# Patient Record
Sex: Male | Born: 1965 | Race: Asian | Hispanic: No | Marital: Married | Smoking: Never smoker
Health system: Southern US, Community
[De-identification: ages and names within clinical notes are randomized; demographics above are authoritative.]

## PROBLEM LIST (undated history)

## (undated) DIAGNOSIS — E119 Type 2 diabetes mellitus without complications: Secondary | ICD-10-CM

## (undated) DIAGNOSIS — M4712 Other spondylosis with myelopathy, cervical region: Principal | ICD-10-CM

## (undated) DIAGNOSIS — K219 Gastro-esophageal reflux disease without esophagitis: Secondary | ICD-10-CM

## (undated) DIAGNOSIS — I10 Essential (primary) hypertension: Secondary | ICD-10-CM

## (undated) DIAGNOSIS — E785 Hyperlipidemia, unspecified: Secondary | ICD-10-CM

## (undated) HISTORY — DX: Type 2 diabetes mellitus without complications: E11.9

## (undated) HISTORY — DX: Gastro-esophageal reflux disease without esophagitis: K21.9

## (undated) HISTORY — DX: Other spondylosis with myelopathy, cervical region: M47.12

## (undated) HISTORY — DX: Hyperlipidemia, unspecified: E78.5

## (undated) HISTORY — DX: Essential (primary) hypertension: I10

---

## 2014-08-08 ENCOUNTER — Telehealth: Payer: Self-pay | Admitting: Family Medicine

## 2014-08-08 NOTE — Telephone Encounter (Signed)
Patient has [employer]. Patient medications reviewed and appointment given for 8/9 with [facility].

## 2014-09-05 ENCOUNTER — Ambulatory Visit (INDEPENDENT_AMBULATORY_CARE_PROVIDER_SITE_OTHER): Payer: 59 | Admitting: Family Medicine

## 2014-09-05 ENCOUNTER — Encounter: Payer: Self-pay | Admitting: Family Medicine

## 2014-09-05 ENCOUNTER — Encounter (INDEPENDENT_AMBULATORY_CARE_PROVIDER_SITE_OTHER): Payer: Self-pay

## 2014-09-05 VITALS — BP 134/76 | HR 66 | Temp 98.0°F | Ht 70.0 in | Wt 230.0 lb

## 2014-09-05 DIAGNOSIS — E119 Type 2 diabetes mellitus without complications: Secondary | ICD-10-CM | POA: Insufficient documentation

## 2014-09-05 DIAGNOSIS — K219 Gastro-esophageal reflux disease without esophagitis: Secondary | ICD-10-CM | POA: Diagnosis not present

## 2014-09-05 DIAGNOSIS — Z139 Encounter for screening, unspecified: Secondary | ICD-10-CM | POA: Diagnosis not present

## 2014-09-05 DIAGNOSIS — I8390 Asymptomatic varicose veins of unspecified lower extremity: Secondary | ICD-10-CM | POA: Insufficient documentation

## 2014-09-05 DIAGNOSIS — R350 Frequency of micturition: Secondary | ICD-10-CM

## 2014-09-05 DIAGNOSIS — I8393 Asymptomatic varicose veins of bilateral lower extremities: Secondary | ICD-10-CM | POA: Diagnosis not present

## 2014-09-05 DIAGNOSIS — I10 Essential (primary) hypertension: Secondary | ICD-10-CM | POA: Diagnosis not present

## 2014-09-05 HISTORY — DX: Essential (primary) hypertension: I10

## 2014-09-05 LAB — POCT URINALYSIS DIPSTICK
BILIRUBIN UA: NEGATIVE
Blood, UA: NEGATIVE
Glucose, UA: NEGATIVE
Ketones, UA: NEGATIVE
LEUKOCYTES UA: NEGATIVE
NITRITE UA: NEGATIVE
PROTEIN UA: NEGATIVE
SPEC GRAV UA: 1.01
Urobilinogen, UA: NEGATIVE
pH, UA: 6.5

## 2014-09-05 LAB — POCT UA - MICROSCOPIC ONLY
BACTERIA, U MICROSCOPIC: NEGATIVE
CRYSTALS, UR, HPF, POC: NEGATIVE
Casts, Ur, LPF, POC: NEGATIVE
EPITHELIAL CELLS, URINE PER MICROSCOPY: NEGATIVE
Mucus, UA: NEGATIVE
RBC, URINE, MICROSCOPIC: NEGATIVE
WBC, Ur, HPF, POC: NEGATIVE
Yeast, UA: NEGATIVE

## 2014-09-05 LAB — POCT GLYCOSYLATED HEMOGLOBIN (HGB A1C): Hemoglobin A1C: 7

## 2014-09-05 LAB — POCT UA - MICROALBUMIN: Microalbumin Ur, POC: NEGATIVE mg/L

## 2014-09-05 MED ORDER — GLUCOSE BLOOD VI STRP: ORAL_STRIP | Status: DC

## 2014-09-05 MED ORDER — TAMSULOSIN HCL 0.4 MG PO CAPS: 0.4000 mg | ORAL_CAPSULE | Freq: Every day | ORAL | Status: DC

## 2014-09-05 MED ORDER — JOBST FOR MEN 20-30MMHG LG MISC: Status: DC

## 2014-09-05 MED ORDER — SITAGLIP PHOS-METFORMIN HCL ER 50-1000 MG PO TB24: 1.0000 | ORAL_TABLET | Freq: Two times a day (BID) | ORAL | Status: DC

## 2014-09-05 MED ORDER — SITAGLIPTIN PHOS-METFORMIN HCL 50-1000 MG PO TABS: 1.0000 | ORAL_TABLET | Freq: Two times a day (BID) | ORAL | Status: DC

## 2014-09-05 NOTE — Progress Notes (Signed)
Subjective:  Patient ID: Kenneth Stone, male    DOB: 1965/07/21  Age: 49 y.o. MRN: [id number]  CC: Establish Care; Diabetes; Hypertension; and Gastrophageal Reflux   HPI Kenneth Stone presents for urinary frequency. He has to get up during the night several times. He was concerned this might be his diabetes.  Follow-up of diabetes. Patient doesOccasionally check blood sugar at home At times it will be over 200.Frequently it is 140-160.He is concerned that also he follows his diet his sugar doesn't seem to be under good control. He would like to consider changing medications. Patient denies symptoms such as  polydipsia, excessive hunger, nausea. No significant hypoglycemic spells noted. Medications as noted below. Taking them regularly without complication/adverse reaction being reported today.   Patient in for follow-up of GERD. Currently asymptomatic taking  PPI daily. There is no chest pain or heartburn. No hematemesis and no melena. No dysphagia or choking. Onset is remote. Progression is stable. Complicating factors, none.   History Kenneth Stone has a past medical history of Diabetes mellitus without complication.   He has no past surgical history on file.   His family history is not on file.He reports that he has never smoked. He does not have any smokeless tobacco history on file. He reports that he drinks about 1.2 oz of alcohol per week. He reports that he does not use illicit drugs.  Current Outpatient Prescriptions on File Prior to Visit  Medication Sig Dispense Refill  . glipiZIDE (GLUCOTROL) 10 MG tablet Take 10 mg by mouth 2 (two) times daily.  5  . pantoprazole (PROTONIX) 40 MG tablet Take 40 mg by mouth daily.  5  . polyethylene glycol powder (GLYCOLAX/MIRALAX) powder 17 GRAMS IN 8 OUNCES OF FLUID DAILY  11   No current facility-administered medications on file prior to visit.    ROS Review of Systems  Constitutional: Negative for fever, chills and diaphoresis.  HENT:  Negative for congestion, rhinorrhea and sore throat.   Respiratory: Negative for cough, shortness of breath and wheezing.   Cardiovascular: Negative for chest pain.       Multiple large varicosities at the knees cause numbness and tingling.he was given gabapentin for this and it has not helped.  Gastrointestinal: Negative for nausea, vomiting, abdominal pain, diarrhea, constipation and abdominal distention.  Endocrine: Positive for polyuria. Negative for cold intolerance, heat intolerance and polydipsia.  Genitourinary: Positive for urgency and frequency (with multiple episodes of nocturia). Negative for dysuria, hematuria, decreased urine volume, discharge, enuresis, difficulty urinating, penile pain and testicular pain.  Musculoskeletal: Negative for joint swelling and arthralgias.  Skin: Negative for rash.  Neurological: Negative for dizziness, tremors, seizures, speech difficulty, weakness, light-headedness and headaches.  Hematological: Negative for adenopathy. Does not bruise/bleed easily.  Psychiatric/Behavioral: Negative.     Objective:  BP 134/76 mmHg  Pulse 66  Temp(Src) 98 F (36.7 C) (Oral)  Ht 5' 10"  (1.778 m)  Wt 230 lb (104.327 kg)  BMI 33.00 kg/m2  Physical Exam  Constitutional: He is oriented to person, place, and time. He appears well-developed and well-nourished. No distress.  HENT:  Head: Normocephalic and atraumatic.  Right Ear: External ear normal.  Left Ear: External ear normal.  Nose: Nose normal.  Mouth/Throat: Oropharynx is clear and moist.  Eyes: Conjunctivae and EOM are normal. Pupils are equal, round, and reactive to light.  Neck: Normal range of motion. Neck supple. No tracheal deviation present. No thyromegaly present.  Cardiovascular: Normal rate, regular rhythm, S1 normal, S2 normal, normal heart  sounds and intact distal pulses.  Exam reveals no gallop and no friction rub.   No murmur heard. Large ropey varicosities bilaterally at the distal medial  thighs and at the medial aspect of the knees and upper calves  Pulmonary/Chest: Effort normal and breath sounds normal. No respiratory distress. He has no wheezes. He has no rales.  Abdominal: Soft. Bowel sounds are normal. He exhibits no distension and no mass. There is no tenderness.  Musculoskeletal: Normal range of motion. He exhibits no edema.  Lymphadenopathy:    He has no cervical adenopathy.  Neurological: He is alert and oriented to person, place, and time. He has normal reflexes.  Skin: Skin is warm and dry.  Psychiatric: He has a normal mood and affect. His behavior is normal. Judgment and thought content normal.  Vitals reviewed.   Assessment & Plan:   Kenneth Stone was seen today for establish care, diabetes, hypertension and gastrophageal reflux.  Diagnoses and all orders for this visit:  Diabetes mellitus type 2, controlled, without complications Orders: -     POCT glycosylated hemoglobin (Hb A1C); Standing -     CMP14+EGFR; Standing -     Lipid panel; Standing -     POCT glycosylated hemoglobin (Hb A1C) -     CMP14+EGFR -     Lipid panel -     POCT UA - Microalbumin -     CBC with Differential/Platelet  Essential hypertension Orders: -     Cancel: POCT CBC; Standing -     CMP14+EGFR; Standing -     POCT CBC -     CMP14+EGFR  Screening Orders: -     Vit D  25 hydroxy (rtn osteoporosis monitoring)  Urinary frequency Orders: -     POCT urinalysis dipstick -     POCT UA - Microscopic Only  Varicose vein of leg Orders: -     Ambulatory referral to Vascular Surgery  Gastroesophageal reflux disease without esophagitis  Other orders -     Discontinue: sitaGLIPtin-metformin (JANUMET) 50-1000 MG per tablet; Take 1 tablet by mouth 2 (two) times daily with a meal. -     SitaGLIPtin-MetFORMIN HCl (JANUMET XR) 50-1000 MG TB24; Take 1 tablet by mouth 2 (two) times daily. -     tamsulosin (FLOMAX) 0.4 MG CAPS capsule; Take 1 capsule (0.4 mg total) by mouth daily after  supper. -     glucose blood test strip; Use as instructed -     Elastic Bandages & Supports (JOBST FOR MEN KNEE HIGH/LG) MISC; Wear on both legs, above knee length daily   I have discontinued Kenneth Stone's gabapentin, JENTADUETO, and sitaGLIPtin-metformin. I am also having him start on SitaGLIPtin-MetFORMIN HCl, tamsulosin, glucose blood, and JOBST FOR MEN KNEE HIGH/LG. Additionally, I am having him maintain his glipiZIDE, pantoprazole, polyethylene glycol powder, and quinapril.  Meds ordered this encounter  Medications  . quinapril (ACCUPRIL) 20 MG tablet    Sig: Take 20 mg by mouth at bedtime.  Marland Kitchen DISCONTD: sitaGLIPtin-metformin (JANUMET) 50-1000 MG per tablet    Sig: Take 1 tablet by mouth 2 (two) times daily with a meal.    Dispense:  60 tablet    Refill:  5  . SitaGLIPtin-MetFORMIN HCl (JANUMET XR) 50-1000 MG TB24    Sig: Take 1 tablet by mouth 2 (two) times daily.    Dispense:  60 tablet    Refill:  5  . tamsulosin (FLOMAX) 0.4 MG CAPS capsule    Sig: Take 1 capsule (0.4 mg  total) by mouth daily after supper.    Dispense:  30 capsule    Refill:  5  . glucose blood test strip    Sig: Use as instructed    Dispense:  100 each    Refill:  12  . Elastic Bandages & Supports (JOBST FOR MEN KNEE HIGH/LG) MISC    Sig: Wear on both legs, above knee length daily    Dispense:  2 each    Refill:  11   Results for orders placed or performed in visit on 09/05/14  POCT glycosylated hemoglobin (Hb A1C)  Result Value Ref Range   Hemoglobin A1C 7.0   POCT urinalysis dipstick  Result Value Ref Range   Color, UA gold    Clarity, UA clear    Glucose, UA negative    Bilirubin, UA negative    Ketones, UA negative    Spec Grav, UA 1.010    Blood, UA negative    pH, UA 6.5    Protein, UA negative    Urobilinogen, UA negative    Nitrite, UA negative    Leukocytes, UA Negative Negative  POCT UA - Microalbumin  Result Value Ref Range   Microalbumin Ur, POC negaitve mg/L  POCT UA -  Microscopic Only  Result Value Ref Range   WBC, Ur, HPF, POC negative    RBC, urine, microscopic negative    Bacteria, U Microscopic negative    Mucus, UA negative    Epithelial cells, urine per micros negative    Crystals, Ur, HPF, POC negative    Casts, Ur, LPF, POC negative    Yeast, UA negative      Follow-up: Return in about 3 months (around 12/06/2014).  Claretta Fraise, M.D.

## 2014-09-06 LAB — CBC WITH DIFFERENTIAL/PLATELET
BASOS: 0 %
Basophils Absolute: 0 10*3/uL (ref 0.0–0.2)
EOS (ABSOLUTE): 0.3 10*3/uL (ref 0.0–0.4)
EOS: 3 %
HEMOGLOBIN: 11.8 g/dL — AB (ref 12.6–17.7)
Hematocrit: 36.6 % — ABNORMAL LOW (ref 37.5–51.0)
IMMATURE GRANS (ABS): 0 10*3/uL (ref 0.0–0.1)
IMMATURE GRANULOCYTES: 0 %
LYMPHS: 41 %
Lymphocytes Absolute: 3.2 10*3/uL — ABNORMAL HIGH (ref 0.7–3.1)
MCH: 25.1 pg — ABNORMAL LOW (ref 26.6–33.0)
MCHC: 32.2 g/dL (ref 31.5–35.7)
MCV: 78 fL — ABNORMAL LOW (ref 79–97)
Monocytes Absolute: 0.7 10*3/uL (ref 0.1–0.9)
Monocytes: 9 %
NEUTROS PCT: 47 %
Neutrophils Absolute: 3.6 10*3/uL (ref 1.4–7.0)
Platelets: 236 10*3/uL (ref 150–379)
RBC: 4.71 x10E6/uL (ref 4.14–5.80)
RDW: 15.8 % — ABNORMAL HIGH (ref 12.3–15.4)
WBC: 7.8 10*3/uL (ref 3.4–10.8)

## 2014-09-06 LAB — CMP14+EGFR
A/G RATIO: 1.1 (ref 1.1–2.5)
ALBUMIN: 4.3 g/dL (ref 3.5–5.5)
ALK PHOS: 49 IU/L (ref 39–117)
ALT: 55 IU/L — AB (ref 0–44)
AST: 50 IU/L — ABNORMAL HIGH (ref 0–40)
BILIRUBIN TOTAL: 0.3 mg/dL (ref 0.0–1.2)
BUN/Creatinine Ratio: 19 (ref 9–20)
BUN: 14 mg/dL (ref 6–24)
CO2: 22 mmol/L (ref 18–29)
Calcium: 9.5 mg/dL (ref 8.7–10.2)
Chloride: 99 mmol/L (ref 97–108)
Creatinine, Ser: 0.75 mg/dL — ABNORMAL LOW (ref 0.76–1.27)
GFR, EST AFRICAN AMERICAN: 124 mL/min/{1.73_m2} (ref >59)
GFR, EST NON AFRICAN AMERICAN: 107 mL/min/{1.73_m2} (ref >59)
Globulin, Total: 3.9 g/dL (ref 1.5–4.5)
Glucose: 113 mg/dL — ABNORMAL HIGH (ref 65–99)
Potassium: 4 mmol/L (ref 3.5–5.2)
Sodium: 137 mmol/L (ref 134–144)
TOTAL PROTEIN: 8.2 g/dL (ref 6.0–8.5)

## 2014-09-06 LAB — LIPID PANEL
CHOLESTEROL TOTAL: 198 mg/dL (ref 100–199)
Chol/HDL Ratio: 4 ratio units (ref 0.0–5.0)
HDL: 50 mg/dL (ref >39)
LDL Calculated: 128 mg/dL — ABNORMAL HIGH (ref 0–99)
Triglycerides: 99 mg/dL (ref 0–149)
VLDL Cholesterol Cal: 20 mg/dL (ref 5–40)

## 2014-09-06 LAB — VITAMIN D 25 HYDROXY (VIT D DEFICIENCY, FRACTURES): Vit D, 25-Hydroxy: 25 ng/mL — ABNORMAL LOW (ref 30.0–100.0)

## 2014-09-22 MED ORDER — SITAGLIP PHOS-METFORMIN HCL ER 50-1000 MG PO TB24: 1.0000 | ORAL_TABLET | Freq: Two times a day (BID) | ORAL | Status: DC

## 2014-09-22 MED ORDER — VITAMIN D (ERGOCALCIFEROL) 1.25 MG (50000 UNIT) PO CAPS: 50000.0000 [IU] | ORAL_CAPSULE | ORAL | Status: DC

## 2014-09-22 NOTE — Addendum Note (Signed)
Addended by: Wardell Heath on: 09/22/2014 12:49 PM   Modules accepted: Orders

## 2014-09-25 ENCOUNTER — Telehealth: Payer: Self-pay

## 2014-09-25 MED ORDER — SAXAGLIPTIN-METFORMIN ER 2.5-1000 MG PO TB24: 1.0000 | ORAL_TABLET | Freq: Two times a day (BID) | ORAL | Status: DC

## 2014-09-25 MED ORDER — LINAGLIPTIN-METFORMIN HCL 2.5-500 MG PO TABS: 2.0000 | ORAL_TABLET | Freq: Every day | ORAL | Status: DC

## 2014-09-25 NOTE — Telephone Encounter (Signed)
Insurance denied authorization for Janumet   Has to have 3 month trial and failure to all of the following: Kazano, Jentadueto and Kombiglyze XR

## 2014-09-25 NOTE — Telephone Encounter (Signed)
Tell the patient that this new medication is essentially the same thing as the original one. Take it the same way and let's see how he does.

## 2014-09-26 NOTE — Telephone Encounter (Signed)
Spoke with patient, he states new diabetes medication is not at pharmacy.  Called [facility] in [place]- they have saxagliptin-metformin ready for pick, as well as vitamin D.  Advised patient these medications are ready to be picked up.  Advised patient of message below from Dr. Livia Snellen.

## 2014-10-03 ENCOUNTER — Other Ambulatory Visit: Payer: Self-pay

## 2014-10-03 DIAGNOSIS — I83893 Varicose veins of bilateral lower extremities with other complications: Secondary | ICD-10-CM

## 2014-11-22 ENCOUNTER — Encounter: Payer: Self-pay | Admitting: Surgery

## 2014-11-27 ENCOUNTER — Ambulatory Visit (HOSPITAL_COMMUNITY)
Admission: RE | Admit: 2014-11-27 | Discharge: 2014-11-27 | Disposition: A | Discharge status: Home / Self Care | Payer: 59 | Source: Ambulatory Visit | Attending: Surgery | Admitting: Surgery

## 2014-11-27 ENCOUNTER — Encounter: Payer: Self-pay | Admitting: Surgery

## 2014-11-27 ENCOUNTER — Ambulatory Visit (INDEPENDENT_AMBULATORY_CARE_PROVIDER_SITE_OTHER): Payer: 59 | Admitting: Surgery

## 2014-11-27 VITALS — BP 154/93 | HR 64 | Ht 70.0 in | Wt 231.0 lb

## 2014-11-27 DIAGNOSIS — E119 Type 2 diabetes mellitus without complications: Secondary | ICD-10-CM | POA: Diagnosis not present

## 2014-11-27 DIAGNOSIS — I83893 Varicose veins of bilateral lower extremities with other complications: Secondary | ICD-10-CM | POA: Insufficient documentation

## 2014-11-27 DIAGNOSIS — I10 Essential (primary) hypertension: Secondary | ICD-10-CM | POA: Diagnosis not present

## 2014-11-27 DIAGNOSIS — I872 Venous insufficiency (chronic) (peripheral): Secondary | ICD-10-CM | POA: Diagnosis not present

## 2014-11-27 NOTE — Progress Notes (Signed)
Patient name: Kenneth Stone MRN: [id number] DOB: 27-Nov-1965 Sex: male   Referred by: Self  Reason for referral:  Chief Complaint  Patient presents with  . Re-evaluation    eval vv's     HISTORY OF PRESENT ILLNESS: This is a 49 year old gentleman comes in today with concerns over bilateral lower extremities.  He is concerned that his veins are becoming more prominent.  They also are starting to itch.  He complains that at the end of the day they ache and he notices that he has an indention from wearing his socks at the end of the day.  He denies a history of DVT.  He has never worn compression stockings.  The patient is an recently diagnosed with diabetes.  He also takes medications for hypertension.  Past Medical History  Diagnosis Date  . Diabetes mellitus without complication ([place])   . GERD (gastroesophageal reflux disease)   . Essential hypertension 09/05/2014    No past surgical history on file.  Social History   Social History  . Marital Status: Married    Spouse Name: N/A  . Number of Children: N/A  . Years of Education: N/A   Occupational History  . Not on file.   Social History Main Topics  . Smoking status: Never Smoker   . Smokeless tobacco: Not on file  . Alcohol Use: 1.2 oz/week    2 Standard drinks or equivalent per week  . Drug Use: No  . Sexual Activity: Not on file   Other Topics Concern  . Not on file   Social History Narrative    Family History  Problem Relation Age of Onset  . Cancer Mother   . Heart disease Father     Allergies as of 11/27/2014  . (No Known Allergies)    Current Outpatient Prescriptions on File Prior to Visit  Medication Sig Dispense Refill  . Elastic Bandages & Supports (JOBST FOR MEN KNEE HIGH/LG) MISC Wear on both legs, above knee length daily 2 each 11  . glipiZIDE (GLUCOTROL) 10 MG tablet Take 10 mg by mouth 2 (two) times daily.  5  . glucose blood test strip Use as instructed 100 each 12  . pantoprazole  (PROTONIX) 40 MG tablet Take 40 mg by mouth daily.  5  . polyethylene glycol powder (GLYCOLAX/MIRALAX) powder 17 GRAMS IN 8 OUNCES OF FLUID DAILY  11  . quinapril (ACCUPRIL) 20 MG tablet Take 20 mg by mouth at bedtime.    . Saxagliptin-Metformin 2.05-998 MG TB24 Take 1 tablet by mouth 2 (two) times daily. For diabetes 60 tablet 5  . tamsulosin (FLOMAX) 0.4 MG CAPS capsule Take 1 capsule (0.4 mg total) by mouth daily after supper. 30 capsule 5  . Vitamin D, Ergocalciferol, (DRISDOL) 50000 UNITS CAPS capsule Take 1 capsule (50,000 Units total) by mouth 2 (two) times a week. For 2 months 30 capsule 0   No current facility-administered medications on file prior to visit.     REVIEW OF SYSTEMS: See history of present illness otherwise negative  PHYSICAL EXAMINATION:  Filed Vitals:   11/27/14 1420 11/27/14 1423  BP: 153/88 154/93  Pulse: 64   Height: 5\' 10"  (1.778 m)   Weight: 231 lb (104.781 kg)   SpO2: 100%    Body mass index is 33.15 kg/(m^2). General: The patient appears their stated age.   HEENT:  No gross abnormalities Pulmonary: Respirations are non-labored Abdomen: Soft and non-tender  Musculoskeletal: There are no major deformities.  Neurologic: No focal weakness or paresthesias are detected, Skin: There are no ulcer or rashes noted. Psychiatric: The patient has normal affect. Cardiovascular: There is a regular rate and rhythm without significant murmur appreciated.  One plus pitting edema bilaterally.  Multiple varicosities present mostly in the medial thigh at the knee  Diagnostic Studies: I have reviewed his venous insufficiency ultrasound.  There is no evidence of DVT.  He does have reflux within the right great saphenous vein with maximum diameter 0.95 cm of the saphenofemoral junction.  There is also reflux within the left great saphenous vein with maximum diameter of 0.83 cm of the saphenofemoral junction.  There is reflux within the deep system on the right and the  common femoral-femoral and popliteal vein.  Is also reflux in the left deep system within the common femoral and popliteal vein.   Assessment:  Bilateral chronic venous insufficiency Plan: The patient is concerned that he is having aching pain in his veins as well as worsening swelling.  He has never worn compression stockings.  I feel the first effort was to place him and 20-30 thigh-high compression stockings.  He is getting fitted for repair today in the office.  He will return for follow-up to see how he does in 3 months.  We discussed proceeding with laser ablation of bilateral saphenous vein to possible stab phlebectomy to alleviate some of his symptoms.  He does have deep vein reflux bilaterally so this may not completely resolve his problems but should make it better.     Eldridge Abrahams, M.D. [facility] Office: [phone] Pager:  [phone]

## 2014-12-04 ENCOUNTER — Other Ambulatory Visit: Payer: Self-pay | Admitting: *Deleted

## 2014-12-04 MED ORDER — GLIPIZIDE 10 MG PO TABS: 10.0000 mg | ORAL_TABLET | Freq: Two times a day (BID) | ORAL | Status: DC

## 2014-12-28 ENCOUNTER — Encounter: Payer: Self-pay | Admitting: Family Medicine

## 2014-12-28 ENCOUNTER — Ambulatory Visit (INDEPENDENT_AMBULATORY_CARE_PROVIDER_SITE_OTHER): Payer: 59 | Admitting: Family Medicine

## 2014-12-28 VITALS — BP 146/83 | HR 66 | Temp 97.3°F | Ht 70.0 in | Wt 234.0 lb

## 2014-12-28 DIAGNOSIS — R319 Hematuria, unspecified: Secondary | ICD-10-CM

## 2014-12-28 DIAGNOSIS — I10 Essential (primary) hypertension: Secondary | ICD-10-CM | POA: Diagnosis not present

## 2014-12-28 DIAGNOSIS — E119 Type 2 diabetes mellitus without complications: Secondary | ICD-10-CM

## 2014-12-28 DIAGNOSIS — K219 Gastro-esophageal reflux disease without esophagitis: Secondary | ICD-10-CM

## 2014-12-28 DIAGNOSIS — Z23 Encounter for immunization: Secondary | ICD-10-CM | POA: Diagnosis not present

## 2014-12-28 DIAGNOSIS — E785 Hyperlipidemia, unspecified: Secondary | ICD-10-CM | POA: Diagnosis not present

## 2014-12-28 DIAGNOSIS — E559 Vitamin D deficiency, unspecified: Secondary | ICD-10-CM | POA: Insufficient documentation

## 2014-12-28 LAB — POCT URINALYSIS DIPSTICK
Bilirubin, UA: NEGATIVE
Ketones, UA: NEGATIVE
Leukocytes, UA: NEGATIVE
NITRITE UA: NEGATIVE
PH UA: 6
PROTEIN UA: NEGATIVE
SPEC GRAV UA: 1.02
UROBILINOGEN UA: NEGATIVE

## 2014-12-28 LAB — POCT GLYCOSYLATED HEMOGLOBIN (HGB A1C): Hemoglobin A1C: 7.4

## 2014-12-28 MED ORDER — QUINAPRIL HCL 40 MG PO TABS: 40.0000 mg | ORAL_TABLET | Freq: Every day | ORAL | Status: DC

## 2014-12-28 MED ORDER — VITAMIN D (ERGOCALCIFEROL) 1.25 MG (50000 UNIT) PO CAPS: 50000.0000 [IU] | ORAL_CAPSULE | ORAL | Status: DC

## 2014-12-28 MED ORDER — PANTOPRAZOLE SODIUM 40 MG PO TBEC: 40.0000 mg | DELAYED_RELEASE_TABLET | Freq: Every day | ORAL | Status: DC

## 2014-12-28 NOTE — Progress Notes (Signed)
Subjective:  Patient ID: Kenneth Stone, male    DOB: 03/06/1965  Age: 49 y.o. MRN: [id number]  CC: 6 month follow up   HPI Kenneth Stone presents for  follow-up of hypertension. Patient has no history of headache chest pain or shortness of breath or recent cough. Patient also denies symptoms of TIA such as numbness weakness lateralizing. Patient checks  blood pressure at home and has not had any elevated readings recently. Patient denies side effects from his medication. States taking it regularly until ran out last week.  Patient also  in for follow-up of elevated cholesterol. Doing well without complaints on current medication. Denies side effects of statin including myalgia and arthralgia and nausea. Also in today for liver function testing. Currently no chest pain, shortness of breath or other cardiovascular related symptoms noted.  Follow-up of diabetes. Patient does check blood sugar at home. Readings run between 140 fasting  and 170-200 PP Patient denies symptoms such as polyuria, polydipsia, excessive hunger, nausea No significant hypoglycemic spells noted. Medications as noted below. Taking them regularly without complication/adverse reaction being reported today.    History Kenneth Stone has a past medical history of Diabetes mellitus without complication ([place]); GERD (gastroesophageal reflux disease); Essential hypertension (09/05/2014); and Hyperlipidemia (12/31/2014).   He has no past surgical history on file.   His family history includes Cancer in his mother; Heart disease in his father.He reports that he has never smoked. He does not have any smokeless tobacco history on file. He reports that he drinks about 1.2 oz of alcohol per week. He reports that he does not use illicit drugs.  Current Outpatient Prescriptions on File Prior to Visit  Medication Sig Dispense Refill  . Elastic Bandages & Supports (JOBST FOR MEN KNEE HIGH/LG) MISC Wear on both legs, above knee length daily 2 each 11    . glipiZIDE (GLUCOTROL) 10 MG tablet Take 1 tablet (10 mg total) by mouth 2 (two) times daily. 60 tablet 0  . glucose blood test strip Use as instructed 100 each 12  . polyethylene glycol powder (GLYCOLAX/MIRALAX) powder 17 GRAMS IN 8 OUNCES OF FLUID DAILY  11  . Saxagliptin-Metformin 2.05-998 MG TB24 Take 1 tablet by mouth 2 (two) times daily. For diabetes 60 tablet 5  . tamsulosin (FLOMAX) 0.4 MG CAPS capsule Take 1 capsule (0.4 mg total) by mouth daily after supper. 30 capsule 5   No current facility-administered medications on file prior to visit.    ROS Review of Systems  Constitutional: Negative for fever, chills, diaphoresis and unexpected weight change.  HENT: Negative for congestion, hearing loss, rhinorrhea and sore throat.   Eyes: Negative for visual disturbance.  Respiratory: Negative for cough and shortness of breath.   Cardiovascular: Negative for chest pain.  Gastrointestinal: Negative for abdominal pain, diarrhea and constipation.  Genitourinary: Negative for dysuria and flank pain.  Musculoskeletal: Negative for joint swelling and arthralgias.  Skin: Negative for rash.  Neurological: Negative for dizziness and headaches.  Psychiatric/Behavioral: Negative for sleep disturbance and dysphoric mood.    Objective:  BP 146/83 mmHg  Pulse 66  Temp(Src) 97.3 F (36.3 C) (Oral)  Ht 5' 10"  (1.778 m)  Wt 234 lb (106.142 kg)  BMI 33.58 kg/m2  BP Readings from Last 3 Encounters:  12/28/14 146/83  11/27/14 154/93  09/05/14 134/76    Wt Readings from Last 3 Encounters:  12/28/14 234 lb (106.142 kg)  11/27/14 231 lb (104.781 kg)  09/05/14 230 lb (104.327 kg)     Physical  Exam  Constitutional: He is oriented to person, place, and time. He appears well-developed and well-nourished. No distress.  HENT:  Head: Normocephalic and atraumatic.  Right Ear: External ear normal.  Left Ear: External ear normal.  Nose: Nose normal.  Mouth/Throat: Oropharynx is clear and  moist.  Eyes: Conjunctivae and EOM are normal. Pupils are equal, round, and reactive to light.  Neck: Normal range of motion. Neck supple. No thyromegaly present.  Cardiovascular: Normal rate, regular rhythm and normal heart sounds.   No murmur heard. Pulmonary/Chest: Effort normal and breath sounds normal. No respiratory distress. He has no wheezes. He has no rales.  Abdominal: Soft. Bowel sounds are normal. He exhibits no distension. There is no tenderness.  Lymphadenopathy:    He has no cervical adenopathy.  Neurological: He is alert and oriented to person, place, and time. He has normal reflexes.  Skin: Skin is warm and dry.  Psychiatric: He has a normal mood and affect. His behavior is normal. Judgment and thought content normal.    Lab Results  Component Value Date   HGBA1C 7.4 12/28/2014   HGBA1C 7.0 09/05/2014    Lab Results  Component Value Date   WBC 7.8 09/05/2014   HCT 36.6* 09/05/2014   GLUCOSE 200* 12/28/2014   CHOL 209* 12/28/2014   TRIG 95 12/28/2014   HDL 51 12/28/2014   LDLCALC 139* 12/28/2014   ALT 57* 12/28/2014   AST 49* 12/28/2014   NA 137 12/28/2014   K 4.1 12/28/2014   CL 98 12/28/2014   CREATININE 0.71* 12/28/2014   BUN 16 12/28/2014   CO2 26 12/28/2014   HGBA1C 7.4 12/28/2014    No results found.  Assessment & Plan:   Kenneth Stone was seen today for 6 month follow up.  Diagnoses and all orders for this visit:  Controlled type 2 diabetes mellitus without complication, without long-term current use of insulin ([facility]) -     POCT glycosylated hemoglobin (Hb A1C) -     CMP14+EGFR -     Microalbumin / creatinine urine ratio -     POCT urinalysis dipstick -     Lipid panel -     VITAMIN D 25 Hydroxy (Vit-D Deficiency, Fractures)  Essential hypertension -     CMP14+EGFR  Gastroesophageal reflux disease without esophagitis -     CMP14+EGFR  Vitamin D deficiency -     CMP14+EGFR -     VITAMIN D 25 Hydroxy (Vit-D Deficiency,  Fractures)  Hyperlipidemia -     Lipid panel  Encounter for immunization  Blood in urine -     POCT urinalysis dipstick; Future -     POCT UA - Microscopic Only; Future  Other orders -     quinapril (ACCUPRIL) 40 MG tablet; Take 1 tablet (40 mg total) by mouth at bedtime. -     pantoprazole (PROTONIX) 40 MG tablet; Take 1 tablet (40 mg total) by mouth daily. -     Vitamin D, Ergocalciferol, (DRISDOL) 50000 UNITS CAPS capsule; Take 1 capsule (50,000 Units total) by mouth 2 (two) times a week. For one month. Then switch to 2000 unit tablet OTC -     Flu Vaccine QUAD 36+ mos IM   I have changed Mr. Pheasant quinapril, pantoprazole, and Vitamin D (Ergocalciferol). I am also having him maintain his polyethylene glycol powder, tamsulosin, glucose blood, JOBST FOR MEN KNEE HIGH/LG, Saxagliptin-Metformin, and glipiZIDE.  Meds ordered this encounter  Medications  . quinapril (ACCUPRIL) 40 MG tablet  Sig: Take 1 tablet (40 mg total) by mouth at bedtime.    Dispense:  90 tablet    Refill:  3  . pantoprazole (PROTONIX) 40 MG tablet    Sig: Take 1 tablet (40 mg total) by mouth daily.    Dispense:  90 tablet    Refill:  1  . Vitamin D, Ergocalciferol, (DRISDOL) 50000 UNITS CAPS capsule    Sig: Take 1 capsule (50,000 Units total) by mouth 2 (two) times a week. For one month. Then switch to 2000 unit tablet OTC    Dispense:  8 capsule    Refill:  0     Follow-up: Return in about 3 months (around 03/28/2015) for diabetes.  Claretta Fraise, M.D.

## 2014-12-29 ENCOUNTER — Other Ambulatory Visit: Payer: Self-pay | Admitting: *Deleted

## 2014-12-29 LAB — CMP14+EGFR
ALBUMIN: 4.4 g/dL (ref 3.5–5.5)
ALT: 57 IU/L — ABNORMAL HIGH (ref 0–44)
AST: 49 IU/L — ABNORMAL HIGH (ref 0–40)
Albumin/Globulin Ratio: 1.2 (ref 1.1–2.5)
Alkaline Phosphatase: 47 IU/L (ref 39–117)
BILIRUBIN TOTAL: 0.3 mg/dL (ref 0.0–1.2)
BUN / CREAT RATIO: 23 — AB (ref 9–20)
BUN: 16 mg/dL (ref 6–24)
CALCIUM: 9.8 mg/dL (ref 8.7–10.2)
CO2: 26 mmol/L (ref 18–29)
CREATININE: 0.71 mg/dL — AB (ref 0.76–1.27)
Chloride: 98 mmol/L (ref 97–106)
GFR calc non Af Amer: 109 mL/min/{1.73_m2} (ref >59)
GFR, EST AFRICAN AMERICAN: 126 mL/min/{1.73_m2} (ref >59)
GLOBULIN, TOTAL: 3.7 g/dL (ref 1.5–4.5)
Glucose: 200 mg/dL — ABNORMAL HIGH (ref 65–99)
Potassium: 4.1 mmol/L (ref 3.5–5.2)
SODIUM: 137 mmol/L (ref 136–144)
TOTAL PROTEIN: 8.1 g/dL (ref 6.0–8.5)

## 2014-12-29 LAB — MICROALBUMIN / CREATININE URINE RATIO
Creatinine, Urine: 72.1 mg/dL
MICROALB/CREAT RATIO: 4.7 mg/g creat (ref 0.0–30.0)
Microalbumin, Urine: 3.4 ug/mL

## 2014-12-29 LAB — LIPID PANEL
CHOL/HDL RATIO: 4.1 ratio (ref 0.0–5.0)
CHOLESTEROL TOTAL: 209 mg/dL — AB (ref 100–199)
HDL: 51 mg/dL (ref >39)
LDL CALC: 139 mg/dL — AB (ref 0–99)
TRIGLYCERIDES: 95 mg/dL (ref 0–149)
VLDL CHOLESTEROL CAL: 19 mg/dL (ref 5–40)

## 2014-12-29 LAB — VITAMIN D 25 HYDROXY (VIT D DEFICIENCY, FRACTURES): Vit D, 25-Hydroxy: 30.7 ng/mL (ref 30.0–100.0)

## 2014-12-29 MED ORDER — CANAGLIFLOZIN 300 MG PO TABS: 300.0000 mg | ORAL_TABLET | Freq: Every day | ORAL | Status: DC

## 2014-12-31 ENCOUNTER — Encounter: Payer: Self-pay | Admitting: Family Medicine

## 2014-12-31 DIAGNOSIS — R319 Hematuria, unspecified: Secondary | ICD-10-CM | POA: Insufficient documentation

## 2014-12-31 DIAGNOSIS — E785 Hyperlipidemia, unspecified: Secondary | ICD-10-CM | POA: Insufficient documentation

## 2014-12-31 DIAGNOSIS — Z23 Encounter for immunization: Secondary | ICD-10-CM | POA: Insufficient documentation

## 2014-12-31 HISTORY — DX: Hyperlipidemia, unspecified: E78.5

## 2015-01-15 ENCOUNTER — Other Ambulatory Visit (INDEPENDENT_AMBULATORY_CARE_PROVIDER_SITE_OTHER): Payer: 59

## 2015-01-15 DIAGNOSIS — R319 Hematuria, unspecified: Secondary | ICD-10-CM

## 2015-01-15 LAB — POCT URINALYSIS DIPSTICK
BILIRUBIN UA: NEGATIVE
KETONES UA: NEGATIVE
Leukocytes, UA: NEGATIVE
Nitrite, UA: NEGATIVE
Protein, UA: NEGATIVE
SPEC GRAV UA: 1.01
UROBILINOGEN UA: NEGATIVE
pH, UA: 5

## 2015-01-15 LAB — POCT UA - MICROSCOPIC ONLY
CASTS, UR, LPF, POC: NEGATIVE
CRYSTALS, UR, HPF, POC: NEGATIVE
MUCUS UA: NEGATIVE
WBC, UR, HPF, POC: NEGATIVE
Yeast, UA: NEGATIVE

## 2015-01-30 ENCOUNTER — Telehealth: Payer: Self-pay | Admitting: Family Medicine

## 2015-01-30 NOTE — Telephone Encounter (Signed)
This opens up a lot of concerns/options. I would like to see him in the office to determine which one is right for him. Thanks, WS.

## 2015-01-30 NOTE — Telephone Encounter (Signed)
Please call with more details about how the medication is not working?

## 2015-01-30 NOTE — Telephone Encounter (Signed)
Was just seen at first of December. Please change my medication and send in a new script. Can not afford to come in at this time.

## 2015-02-05 ENCOUNTER — Ambulatory Visit: Payer: Self-pay | Admitting: Pediatrics

## 2015-02-05 ENCOUNTER — Ambulatory Visit: Payer: Self-pay | Admitting: Family Medicine

## 2015-02-06 ENCOUNTER — Ambulatory Visit (INDEPENDENT_AMBULATORY_CARE_PROVIDER_SITE_OTHER): Payer: BLUE CROSS/BLUE SHIELD | Admitting: Family Medicine

## 2015-02-06 ENCOUNTER — Encounter: Payer: Self-pay | Admitting: Family Medicine

## 2015-02-06 ENCOUNTER — Other Ambulatory Visit: Payer: Self-pay | Admitting: Family Medicine

## 2015-02-06 VITALS — BP 139/84 | HR 70 | Temp 97.4°F | Ht 70.0 in | Wt 230.6 lb

## 2015-02-06 DIAGNOSIS — E119 Type 2 diabetes mellitus without complications: Secondary | ICD-10-CM | POA: Diagnosis not present

## 2015-02-06 DIAGNOSIS — I809 Phlebitis and thrombophlebitis of unspecified site: Secondary | ICD-10-CM

## 2015-02-06 DIAGNOSIS — R319 Hematuria, unspecified: Secondary | ICD-10-CM

## 2015-02-06 LAB — POCT UA - MICROSCOPIC ONLY
BACTERIA, U MICROSCOPIC: NEGATIVE
Casts, Ur, LPF, POC: NEGATIVE
Crystals, Ur, HPF, POC: NEGATIVE
Epithelial cells, urine per micros: NEGATIVE
Mucus, UA: NEGATIVE
Yeast, UA: NEGATIVE

## 2015-02-06 LAB — POCT URINALYSIS DIPSTICK
Bilirubin, UA: NEGATIVE
GLUCOSE UA: NEGATIVE
Ketones, UA: NEGATIVE
LEUKOCYTES UA: NEGATIVE
NITRITE UA: NEGATIVE
PROTEIN UA: NEGATIVE
Spec Grav, UA: 1.015
UROBILINOGEN UA: NEGATIVE
pH, UA: 6

## 2015-02-06 MED ORDER — GLIPIZIDE 10 MG PO TABS: 10.0000 mg | ORAL_TABLET | Freq: Two times a day (BID) | ORAL | Status: DC

## 2015-02-06 MED ORDER — SAXAGLIPTIN-METFORMIN ER 2.5-1000 MG PO TB24
1.0000 | ORAL_TABLET | Freq: Two times a day (BID) | ORAL | Status: DC
Start: 2015-02-06 — End: 2015-05-28

## 2015-02-06 MED ORDER — DICLOFENAC SODIUM 75 MG PO TBEC: 75.0000 mg | DELAYED_RELEASE_TABLET | Freq: Two times a day (BID) | ORAL | Status: DC

## 2015-02-06 NOTE — Patient Instructions (Signed)
    Great to meet you!  Take diclofenac 1 pill twice daily      Phlebitis Phlebitis is soreness and puffiness (swelling) in a vein.  HOME CARE  Only take medicine as told by your doctor.  Raise (elevate) the affected limb on a pillow as told by your doctor.  Keep a warm pack on the affected vein as told by your doctor. Do not sleep with a heating pad.  Use special stockings or bandages around the area of the affected vein as told by your doctor. These will speed healing and keep the condition from coming back.  Talk to your doctor about all the medicines you take.  Get follow-up blood tests as told by your doctor.  If the phlebitis is in your legs:  Avoid standing or resting for long periods.  Keep your legs moving. Raise your legs when you sit or lie.  Do not smoke.  Follow-up with your doctor as told. GET HELP IF:  You have strange bruises or bleeding.  Your puffiness or pain in the affected area is not getting better.  You are taking medicine to lessen puffiness (anti-inflammatory medicine), and you get belly pain.  You have a fever. GET HELP RIGHT AWAY IF:   The phlebitis gets worse and you have more pain, puffiness (swelling), or redness.  You have trouble breathing or have chest pain. MAKE SURE YOU:   Understand these instructions.  Will watch your condition.  Will get help right away if you are not doing well or get worse.   This information is not intended to replace advice given to you by your health care provider. Make sure you discuss any questions you have with your health care provider.   Document Released: 01/01/2009 Document Revised: 01/18/2013 Document Reviewed: 09/20/2012 Elsevier Interactive Patient Education [employer].

## 2015-02-06 NOTE — Progress Notes (Addendum)
   HPI  Patient presents today to discuss diabetes and right lesions.  He describes "knots" on his right inner thigh for about 3 days. He states that they're tender to palpation, he has an appointment with a vascular surgeon to address varicose veins at the end of this month. This is never happened before. He has no leg edema, calf tenderness, or difficulty walking. He feels that it's also developing on his left inner thigh.  Diabetes Recently tried invokana but did not like this due to polyuria, stating that he can't sleep due to urinating so much. He requests to go back onto onglyza/metformin and glipizide combination   PMH: Smoking status noted ROS: Per HPI  Objective: BP 139/84 mmHg  Pulse 70  Temp(Src) 97.4 F (36.3 C) (Oral)  Ht 5\' 10"  (1.778 m)  Wt 230 lb 9.6 oz (104.599 kg)  BMI 33.09 kg/m2 Gen: NAD, alert, cooperative with exam HEENT: NCAT CV: RRR, good S1/S2, no murmur Resp: CTABL, no wheezes, non-labored Ext: Right inner thigh with palpable superficial cord on the medial knee, swelling, tenderness to palpation, or Homans sign. Left inner thigh with similar cord with less erythema and tenderness Neuro: Alert and oriented, No gross deficits  Assessment and plan:  # Superficial thrombophlebitis Scheduled NSAIDs Discussed the seriousness of this diagnosis and recommended venous duplex within 48 hours. I considered anticoagulation, however he has no signs of acute DVT is no Tenderness, edema, or Homans sign. I recommended follow-up within 2 weeks when we will consider repeat ultrasound  # Diabetes Patient did not tolerate invokana- continue Restart previous regimen, we discussed more aggressive diet changes Consider GLP-1 if he does not improve after more aggressive diet control  Hematuria Persistent, refer to uriology.  Nursing to call and discuss.   Orders Placed This Encounter  Procedures  . POCT UA - Microscopic Only  . POCT urinalysis dipstick     Meds ordered this encounter  Medications  . glipiZIDE (GLUCOTROL) 10 MG tablet    Sig: Take 1 tablet (10 mg total) by mouth 2 (two) times daily.    Dispense:  60 tablet    Refill:  1  . Saxagliptin-Metformin 2.05-998 MG TB24    Sig: Take 1 tablet by mouth 2 (two) times daily. For diabetes    Dispense:  60 tablet    Refill:  5  . diclofenac (VOLTAREN) 75 MG EC tablet    Sig: Take 1 tablet (75 mg total) by mouth 2 (two) times daily.    Dispense:  30 tablet    Refill:  0    Laroy Apple, MD [facility] 02/06/2015, 6:27 PM

## 2015-02-08 ENCOUNTER — Ambulatory Visit (HOSPITAL_COMMUNITY): Payer: BLUE CROSS/BLUE SHIELD

## 2015-02-12 ENCOUNTER — Ambulatory Visit (HOSPITAL_COMMUNITY): Admission: RE | Admit: 2015-02-12 | Payer: BLUE CROSS/BLUE SHIELD | Source: Ambulatory Visit

## 2015-02-16 ENCOUNTER — Encounter: Payer: Self-pay | Admitting: Vascular Surgery

## 2015-02-19 ENCOUNTER — Encounter: Payer: Self-pay | Admitting: Vascular Surgery

## 2015-02-27 ENCOUNTER — Ambulatory Visit (INDEPENDENT_AMBULATORY_CARE_PROVIDER_SITE_OTHER): Payer: BLUE CROSS/BLUE SHIELD | Admitting: Vascular Surgery

## 2015-02-27 ENCOUNTER — Encounter: Payer: Self-pay | Admitting: Vascular Surgery

## 2015-02-27 VITALS — BP 137/90 | HR 68 | Temp 98.0°F | Resp 16 | Ht 72.0 in | Wt 230.0 lb

## 2015-02-27 DIAGNOSIS — I83893 Varicose veins of bilateral lower extremities with other complications: Secondary | ICD-10-CM | POA: Diagnosis not present

## 2015-02-27 NOTE — Progress Notes (Signed)
Subjective:     Patient ID: Kenneth Stone, male   DOB: 07/29/1965, 50 y.o.   MRN: [id number]  HPI this 50 year old male returns for continued follow-up regarding his painful varicosities and swelling of both lower extremities. He saw Dr. Trula Slade 3 months ago. He has tried long-leg elastic compression stockings 20-30 mm gradient as well as elevation and ibuprofen with no improvement in his pain or swelling. In addition he developed painful swelling involving the varicosities of his right leg from the proximal thigh to just below the knee and he has been treated with anti-inflammatory medications but has not had a repeat venous duplex exam on the right side. He continues to have symptoms on the left side which are affecting his daily living and resistant to conservative measures.  Past Medical History  Diagnosis Date  . Diabetes mellitus without complication ([place])   . GERD (gastroesophageal reflux disease)   . Essential hypertension 09/05/2014  . Hyperlipidemia 12/31/2014    Social History  Substance Use Topics  . Smoking status: Never Smoker   . Smokeless tobacco: Not on file  . Alcohol Use: 1.2 oz/week    2 Standard drinks or equivalent per week    Family History  Problem Relation Age of Onset  . Cancer Mother   . Heart disease Father     No Known Allergies   Current outpatient prescriptions:  .  diclofenac (VOLTAREN) 75 MG EC tablet, Take 1 tablet (75 mg total) by mouth 2 (two) times daily., Disp: 30 tablet, Rfl: 0 .  Elastic Bandages & Supports (JOBST FOR MEN KNEE HIGH/LG) MISC, Wear on both legs, above knee length daily (Patient not taking: Reported on 02/06/2015), Disp: 2 each, Rfl: 11 .  glipiZIDE (GLUCOTROL) 10 MG tablet, Take 1 tablet (10 mg total) by mouth 2 (two) times daily., Disp: 60 tablet, Rfl: 1 .  glucose blood test strip, Use as instructed, Disp: 100 each, Rfl: 12 .  pantoprazole (PROTONIX) 40 MG tablet, Take 1 tablet (40 mg total) by mouth daily., Disp: 90 tablet, Rfl:  1 .  polyethylene glycol powder (GLYCOLAX/MIRALAX) powder, Reported on 02/06/2015, Disp: , Rfl: 11 .  quinapril (ACCUPRIL) 40 MG tablet, Take 1 tablet (40 mg total) by mouth at bedtime. (Patient taking differently: Take 10 mg by mouth at bedtime. ), Disp: 90 tablet, Rfl: 3 .  Saxagliptin-Metformin 2.05-998 MG TB24, Take 1 tablet by mouth 2 (two) times daily. For diabetes, Disp: 60 tablet, Rfl: 5 .  tamsulosin (FLOMAX) 0.4 MG CAPS capsule, Take 1 capsule (0.4 mg total) by mouth daily after supper., Disp: 30 capsule, Rfl: 5 .  Vitamin D, Ergocalciferol, (DRISDOL) 50000 UNITS CAPS capsule, Take 1 capsule (50,000 Units total) by mouth 2 (two) times a week. For one month. Then switch to 2000 unit tablet OTC, Disp: 8 capsule, Rfl: 0  Filed Vitals:   02/27/15 1357  BP: 137/90  Pulse: 68  Temp: 98 F (36.7 C)  Resp: 16  Height: 6' (1.829 m)  Weight: 230 lb (104.327 kg)  SpO2: 98%    Body mass index is 31.19 kg/(m^2).           Review of Systems denies chest pain, dyspnea on exertion, PND, orthopnea, hemoptysis, claudication.     Objective:   Physical Exam BP 137/90 mmHg  Pulse 68  Temp(Src) 98 F (36.7 C)  Resp 16  Ht 6' (1.829 m)  Wt 230 lb (104.327 kg)  BMI 31.19 kg/m2  SpO2 98%  General alert and oriented 3  in no apparent distress Lungs no rhonchi or wheezing Right leg with acute thrombophlebitis medially from the knee to the proximal thigh over the great saphenous vein with thrombosed varicosities. Very few patent varicosities remain. Left leg with large bulging varicosities beginning in the distal medial 5 extending into the medial calf with 1+ distal edema. 3+ dorsalis pedis pulse palpable.  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 In the interim patient developed acute superficial thrombophlebitis right leg with thrombosed right great saphenous vein up to saphenofemoral junction Left leg has persistent gross reflux left great saphenous vein supplying painful bulging varicosities and causing distal edema. The symptoms are resistant to conservative measures and affecting his daily living    Plan:     Patient needs laser ablation left great saphenous vein plus greater than 20 stab phlebectomy for painful varicosities We will proceed with precertification to perform this in the near future to relieve his symptoms which are affecting his daily living and resistant to conservative measures

## 2015-03-07 ENCOUNTER — Ambulatory Visit: Payer: BLUE CROSS/BLUE SHIELD | Admitting: Urology

## 2015-04-11 ENCOUNTER — Other Ambulatory Visit: Payer: Self-pay | Admitting: Urology

## 2015-04-11 ENCOUNTER — Ambulatory Visit (INDEPENDENT_AMBULATORY_CARE_PROVIDER_SITE_OTHER): Payer: BLUE CROSS/BLUE SHIELD | Admitting: Urology

## 2015-04-11 DIAGNOSIS — R319 Hematuria, unspecified: Secondary | ICD-10-CM

## 2015-04-11 DIAGNOSIS — R350 Frequency of micturition: Secondary | ICD-10-CM

## 2015-04-11 DIAGNOSIS — R3129 Other microscopic hematuria: Secondary | ICD-10-CM

## 2015-05-04 ENCOUNTER — Ambulatory Visit (HOSPITAL_COMMUNITY): Payer: BLUE CROSS/BLUE SHIELD

## 2015-05-16 ENCOUNTER — Ambulatory Visit: Payer: BLUE CROSS/BLUE SHIELD | Admitting: Urology

## 2015-05-21 ENCOUNTER — Other Ambulatory Visit: Payer: Self-pay | Admitting: *Deleted

## 2015-05-21 DIAGNOSIS — E119 Type 2 diabetes mellitus without complications: Secondary | ICD-10-CM

## 2015-05-21 MED ORDER — GLIPIZIDE 10 MG PO TABS: 10.0000 mg | ORAL_TABLET | Freq: Two times a day (BID) | ORAL | Status: DC

## 2015-05-24 ENCOUNTER — Ambulatory Visit (INDEPENDENT_AMBULATORY_CARE_PROVIDER_SITE_OTHER): Payer: BLUE CROSS/BLUE SHIELD | Admitting: Nurse Practitioner

## 2015-05-24 VITALS — BP 132/78 | HR 78 | Temp 97.6°F | Ht 72.0 in | Wt 234.0 lb

## 2015-05-24 DIAGNOSIS — L255 Unspecified contact dermatitis due to plants, except food: Secondary | ICD-10-CM | POA: Diagnosis not present

## 2015-05-24 MED ORDER — METHYLPREDNISOLONE ACETATE 80 MG/ML IJ SUSP
80.0000 mg | Freq: Once | INTRAMUSCULAR | Status: AC
  Administered 2015-05-24: 80 mg via INTRAMUSCULAR

## 2015-05-24 NOTE — Progress Notes (Signed)
   Subjective:    Patient ID: Kenneth Stone, male    DOB: January 14, 1966, 50 y.o.   MRN: [id number]  HPI Patient here today C/O rash on face. Started 2 days ago and is spreading- very itchy-painful. Has not been outside doing any yard work or around [employer] that he knows of.   Review of Systems  Constitutional: Negative.   HENT: Negative.   Respiratory: Negative.   Cardiovascular: Negative.   Genitourinary: Negative.   Neurological: Negative.   Psychiatric/Behavioral: Negative.   All other systems reviewed and are negative.      Objective:   Physical Exam  Constitutional: He is oriented to person, place, and time. He appears well-developed and well-nourished. No distress.  Cardiovascular: Normal rate, regular rhythm and normal heart sounds.   Pulmonary/Chest: Effort normal and breath sounds normal.  Neurological: He is alert and oriented to person, place, and time.  Skin: Skin is warm.  Erythematous maculopapular rash on forehead and behind both ears   BP 132/78 mmHg  Pulse 78  Temp(Src) 97.6 F (36.4 C) (Oral)  Ht 6' (1.829 m)  Wt 234 lb (106.142 kg)  BMI 31.73 kg/m2        Assessment & Plan:   1. Contact dermatitis due to plant    Meds ordered this encounter  Medications  . methylPREDNISolone acetate (DEPO-MEDROL) injection 80 mg    Sig:    Avid scratching Cool compresses Calamine lotion OTC Benadryl for itching RTO prn  Mary-Margaret Hassell Done, FNP

## 2015-05-28 ENCOUNTER — Encounter: Payer: Self-pay | Admitting: Family Medicine

## 2015-05-28 ENCOUNTER — Ambulatory Visit (INDEPENDENT_AMBULATORY_CARE_PROVIDER_SITE_OTHER): Payer: BLUE CROSS/BLUE SHIELD

## 2015-05-28 ENCOUNTER — Ambulatory Visit (INDEPENDENT_AMBULATORY_CARE_PROVIDER_SITE_OTHER): Payer: BLUE CROSS/BLUE SHIELD | Admitting: Family Medicine

## 2015-05-28 ENCOUNTER — Encounter (INDEPENDENT_AMBULATORY_CARE_PROVIDER_SITE_OTHER): Payer: Self-pay

## 2015-05-28 VITALS — BP 132/74 | HR 64 | Temp 97.4°F | Ht 73.83 in | Wt 232.6 lb

## 2015-05-28 DIAGNOSIS — W19XXXA Unspecified fall, initial encounter: Secondary | ICD-10-CM

## 2015-05-28 DIAGNOSIS — R319 Hematuria, unspecified: Secondary | ICD-10-CM | POA: Diagnosis not present

## 2015-05-28 DIAGNOSIS — E119 Type 2 diabetes mellitus without complications: Secondary | ICD-10-CM | POA: Diagnosis not present

## 2015-05-28 DIAGNOSIS — R0789 Other chest pain: Secondary | ICD-10-CM

## 2015-05-28 LAB — URINALYSIS, COMPLETE
BILIRUBIN UA: NEGATIVE
GLUCOSE, UA: NEGATIVE
Ketones, UA: NEGATIVE
LEUKOCYTES UA: NEGATIVE
Nitrite, UA: NEGATIVE
PROTEIN UA: NEGATIVE
Specific Gravity, UA: 1.025 (ref 1.005–1.030)
UUROB: 0.2 mg/dL (ref 0.2–1.0)
pH, UA: 6 (ref 5.0–7.5)

## 2015-05-28 LAB — MICROSCOPIC EXAMINATION
BACTERIA UA: NONE SEEN
WBC, UA: NONE SEEN /hpf (ref 0–?)

## 2015-05-28 LAB — BAYER DCA HB A1C WAIVED: HB A1C: 7.5 % — AB (ref <7.0)

## 2015-05-28 MED ORDER — SAXAGLIPTIN-METFORMIN ER 2.5-1000 MG PO TB24: 1.0000 | ORAL_TABLET | Freq: Two times a day (BID) | ORAL | Status: DC

## 2015-05-28 MED ORDER — GLIPIZIDE 10 MG PO TABS: 10.0000 mg | ORAL_TABLET | Freq: Two times a day (BID) | ORAL | Status: DC

## 2015-05-28 MED ORDER — TRAMADOL HCL 50 MG PO TABS: 50.0000 mg | ORAL_TABLET | Freq: Three times a day (TID) | ORAL | Status: DC | PRN

## 2015-05-28 MED ORDER — PANTOPRAZOLE SODIUM 40 MG PO TBEC: 40.0000 mg | DELAYED_RELEASE_TABLET | Freq: Every day | ORAL | Status: DC

## 2015-05-28 NOTE — Patient Instructions (Signed)
Great to see you!  For your rib: Try ice 3-4 times a day 2 aleve twice a day, for only 1 week, then as needed Tramadol for night time pain.

## 2015-05-28 NOTE — Addendum Note (Signed)
Addended by: Chevis Pretty on: 05/28/2015 09:51 AM   Modules accepted: Level of Service

## 2015-05-28 NOTE — Progress Notes (Signed)
   HPI  Patient presents today here after a fall.  Patient explains that 2 days ago he tripped over his dog and fell on his left anterior ribs into the corner of his stairs. Since that time he's had point tenderness and tenderness whenever he takes a deep inspiration He denies any fever, chills, dyspnea.  Diabetes Blood sugar averages 140-150. No hypoglycemia Only taking glipizide as needed Good medication compliance with onglyza met  Hematuria No gross hematuria Cannot afford the CT scan that was ordered by urology, we are discussing this with her financial department. Repeated urine today by his request    PMH: Smoking status noted ROS: Per HPI  Objective: BP 132/74 mmHg  Pulse 64  Temp(Src) 97.4 F (36.3 C) (Oral)  Ht 6' 1.83" (1.875 m)  Wt 232 lb 9.6 oz (105.507 kg)  BMI 30.01 kg/m2 Gen: NAD, alert, cooperative with exam HEENT: NCAT CV: RRR, good S1/S2, no murmur Chest wall: Point tenderness over the eighth and ninth ribs on the left lateral midclavicular line, the area of the BB on the x-ray Resp: CTABL, no wheezes, non-labored Ext: No edema, warm Neuro: Alert and oriented, No gross deficits   Chest x-ray: Suspicion for small rib fracture under the BB on the film, no pneumothorax or acute findings otherwise  Assessment and plan:  # Chest wall pain, possible rib fracture Discussed ice, NSAIDs, tramadol for most severe pain Discussed usual course of illness Return to clinic for any worsening symptoms or failure to improve as expected.  # Type 2 diabetes A1c is stable 7.4-7.5 today Continue current medications, discussed glipizide compliance  # Hematuria Urinalysis with only 0-2 RBCs/HPF today Discussed the CT with our financial department who will help him navigate the process. Discussed that even if the blood has resolved his    Orders Placed This Encounter  Procedures  . DG Ribs Unilateral W/Chest Left    Standing Status: Future     Number of  Occurrences: 1     Standing Expiration Date: 07/27/2016    Order Specific Question:  Reason for Exam (SYMPTOM  OR DIAGNOSIS REQUIRED)    Answer:  fall    Order Specific Question:  Preferred imaging location?    Answer:  Internal  . Bayer DCA Hb A1c Waived  . Urinalysis, Complete    Meds ordered this encounter  Medications  . Saxagliptin-Metformin 2.05-998 MG TB24    Sig: Take 1 tablet by mouth 2 (two) times daily. For diabetes    Dispense:  60 tablet    Refill:  5  . pantoprazole (PROTONIX) 40 MG tablet    Sig: Take 1 tablet (40 mg total) by mouth daily.    Dispense:  90 tablet    Refill:  1  . traMADol (ULTRAM) 50 MG tablet    Sig: Take 1 tablet (50 mg total) by mouth every 8 (eight) hours as needed.    Dispense:  30 tablet    Refill:  0    Laroy Apple, MD [facility] 05/28/2015, 5:03 PM

## 2015-05-29 ENCOUNTER — Other Ambulatory Visit: Payer: Self-pay | Admitting: *Deleted

## 2015-05-29 ENCOUNTER — Telehealth: Payer: Self-pay | Admitting: Family Medicine

## 2015-05-29 DIAGNOSIS — R319 Hematuria, unspecified: Secondary | ICD-10-CM

## 2015-05-29 NOTE — Progress Notes (Signed)
Patient aware.

## 2015-06-05 ENCOUNTER — Ambulatory Visit (HOSPITAL_COMMUNITY)
Admission: RE | Admit: 2015-06-05 | Discharge: 2015-06-05 | Disposition: A | Discharge status: Home / Self Care | Payer: BLUE CROSS/BLUE SHIELD | Source: Ambulatory Visit | Attending: Family Medicine | Admitting: Family Medicine

## 2015-06-05 DIAGNOSIS — R319 Hematuria, unspecified: Secondary | ICD-10-CM | POA: Diagnosis not present

## 2015-06-05 LAB — POCT I-STAT CREATININE: CREATININE: 0.7 mg/dL (ref 0.61–1.24)

## 2015-06-05 MED ORDER — IOPAMIDOL (ISOVUE-300) INJECTION 61%
125.0000 mL | Freq: Once | INTRAVENOUS | Status: AC | PRN
  Administered 2015-06-05: 125 mL via INTRAVENOUS

## 2015-06-08 ENCOUNTER — Ambulatory Visit: Payer: BLUE CROSS/BLUE SHIELD | Admitting: Family Medicine

## 2015-08-28 ENCOUNTER — Ambulatory Visit: Payer: BLUE CROSS/BLUE SHIELD | Admitting: Family Medicine

## 2015-09-07 ENCOUNTER — Ambulatory Visit (INDEPENDENT_AMBULATORY_CARE_PROVIDER_SITE_OTHER): Payer: BLUE CROSS/BLUE SHIELD | Admitting: Family Medicine

## 2015-09-07 ENCOUNTER — Encounter: Payer: Self-pay | Admitting: Family Medicine

## 2015-09-07 VITALS — BP 131/76 | HR 70 | Temp 96.9°F | Ht 73.83 in | Wt 230.4 lb

## 2015-09-07 DIAGNOSIS — I10 Essential (primary) hypertension: Secondary | ICD-10-CM

## 2015-09-07 DIAGNOSIS — K219 Gastro-esophageal reflux disease without esophagitis: Secondary | ICD-10-CM | POA: Diagnosis not present

## 2015-09-07 DIAGNOSIS — E119 Type 2 diabetes mellitus without complications: Secondary | ICD-10-CM

## 2015-09-07 LAB — BAYER DCA HB A1C WAIVED: HB A1C: 7.1 % — AB (ref <7.0)

## 2015-09-07 MED ORDER — GLIPIZIDE 10 MG PO TABS: 10.0000 mg | ORAL_TABLET | Freq: Two times a day (BID) | ORAL | 5 refills | Status: DC

## 2015-09-07 NOTE — Progress Notes (Signed)
   HPI  Patient presents today to follow-up for diabetes, hypertension, and GERD.  Diabetes Good medication compliance No hypoglycemia Average fasting blood sugar is 140-150 Average postprandials 180-200 No foot numbness.   Hypertension No shortness of breath, headache, leg edema, or palpitations. He does have occasional left-sided chest pain which is associated with eating certain foods and improved with Protonix. His GERD is otherwise well controlled with Protonix.  He would like to see an eye doctor in our town.  Also occasionally has nonexertional chest pain that improves with massage in the left chest.   PMH: Smoking status noted ROS: Per HPI  Objective: BP 131/76   Pulse 70   Temp (!) 96.9 F (36.1 C) (Oral)   Ht 6' 1.83" (1.875 m)   Wt 230 lb 6.4 oz (104.5 kg)   BMI 29.72 kg/m  Gen: NAD, alert, cooperative with exam HEENT: NCAT, CV: RRR, good S1/S2, no murmur Resp: CTABL, no wheezes, non-labored Ext: No edema, warm Neuro: Alert and oriented, No gross deficits  Assessment and plan:  # 2 diabetes Reasonably well controlled, A1c 7.1, this is down from 7.53 months ago Continue Onglyza, metformin, and glipizide. Referring ophthalmology  # Hypertension Well controlled Continue quinapril  # Gerd Controlled with PPI, continue    Orders Placed This Encounter  Procedures  . Bayer DCA Hb A1c Waived  . CMP14+EGFR  . Ambulatory referral to Ophthalmology    Referral Priority:   Routine    Referral Type:   Consultation    Referral Reason:   Specialty Services Required    Requested Specialty:   Ophthalmology    Number of Visits Requested:   [place], MD [facility] 09/07/2015, 2:18 PM

## 2015-09-07 NOTE — Patient Instructions (Signed)
Great to see you!  Your diabetes looks pretty good, your A1C was 7.1 which is equivalent to an average glucose of 157. OUr goal is less than 6.5 but this is really not bad and is an improvement since your last check.   Try to cut out the high sugar drinks we talked about.

## 2015-09-08 LAB — CMP14+EGFR
A/G RATIO: 1.1 — AB (ref 1.2–2.2)
ALBUMIN: 4.3 g/dL (ref 3.5–5.5)
ALT: 50 IU/L — AB (ref 0–44)
AST: 43 IU/L — ABNORMAL HIGH (ref 0–40)
Alkaline Phosphatase: 46 IU/L (ref 39–117)
BILIRUBIN TOTAL: 0.3 mg/dL (ref 0.0–1.2)
BUN / CREAT RATIO: 19 (ref 9–20)
BUN: 13 mg/dL (ref 6–24)
CHLORIDE: 99 mmol/L (ref 96–106)
CO2: 24 mmol/L (ref 18–29)
CREATININE: 0.7 mg/dL — AB (ref 0.76–1.27)
Calcium: 9.9 mg/dL (ref 8.7–10.2)
GFR, EST AFRICAN AMERICAN: 126 mL/min/{1.73_m2} (ref >59)
GFR, EST NON AFRICAN AMERICAN: 109 mL/min/{1.73_m2} (ref >59)
GLOBULIN, TOTAL: 3.8 g/dL (ref 1.5–4.5)
Glucose: 93 mg/dL (ref 65–99)
Potassium: 4.2 mmol/L (ref 3.5–5.2)
Sodium: 139 mmol/L (ref 134–144)
TOTAL PROTEIN: 8.1 g/dL (ref 6.0–8.5)

## 2015-09-18 DIAGNOSIS — Z7984 Long term (current) use of oral hypoglycemic drugs: Secondary | ICD-10-CM | POA: Diagnosis not present

## 2015-09-18 DIAGNOSIS — H40013 Open angle with borderline findings, low risk, bilateral: Secondary | ICD-10-CM | POA: Diagnosis not present

## 2015-09-18 DIAGNOSIS — E119 Type 2 diabetes mellitus without complications: Secondary | ICD-10-CM | POA: Diagnosis not present

## 2015-09-18 LAB — HM DIABETES EYE EXAM

## 2015-09-30 DIAGNOSIS — Z23 Encounter for immunization: Secondary | ICD-10-CM | POA: Diagnosis not present

## 2015-11-12 DIAGNOSIS — K080 Exfoliation of teeth due to systemic causes: Secondary | ICD-10-CM | POA: Diagnosis not present

## 2015-12-10 ENCOUNTER — Ambulatory Visit: Payer: BLUE CROSS/BLUE SHIELD | Admitting: Family Medicine

## 2015-12-14 ENCOUNTER — Ambulatory Visit (INDEPENDENT_AMBULATORY_CARE_PROVIDER_SITE_OTHER): Payer: BLUE CROSS/BLUE SHIELD | Admitting: Family Medicine

## 2015-12-14 ENCOUNTER — Encounter: Payer: Self-pay | Admitting: Family Medicine

## 2015-12-14 VITALS — BP 133/84 | HR 71 | Temp 97.3°F | Ht 73.83 in | Wt 231.4 lb

## 2015-12-14 DIAGNOSIS — I10 Essential (primary) hypertension: Secondary | ICD-10-CM | POA: Diagnosis not present

## 2015-12-14 DIAGNOSIS — E119 Type 2 diabetes mellitus without complications: Secondary | ICD-10-CM

## 2015-12-14 DIAGNOSIS — E785 Hyperlipidemia, unspecified: Secondary | ICD-10-CM | POA: Diagnosis not present

## 2015-12-14 DIAGNOSIS — K5900 Constipation, unspecified: Secondary | ICD-10-CM

## 2015-12-14 LAB — BAYER DCA HB A1C WAIVED: HB A1C: 8.4 % — AB (ref <7.0)

## 2015-12-14 MED ORDER — SAXAGLIPTIN-METFORMIN ER 5-1000 MG PO TB24: 1.0000 | ORAL_TABLET | Freq: Every day | ORAL | 3 refills | Status: DC

## 2015-12-14 NOTE — Progress Notes (Signed)
   HPI  Patient presents today here for f/u DM2, HTN, Constipation, and HLD  He is fasting  DM2 Good med compliance Average fasting blood sugars 150-170 Average postprandial 200 250 Hypoglycemia only when patient misses a meal, easily recovers with food. No neuropathy.  Hypertension Not checking blood pressures Good medication compliance No chest pain or palpitations or dyspnea.  Hyperlipidemia Watching his diet moderately, also her diabetes Occupationally active but no formal exercise routine  PMH: Smoking status noted ROS: Per HPI  Objective: BP 133/84   Pulse 71   Temp 97.3 F (36.3 C) (Oral)   Ht 6' 1.83" (1.875 m)   Wt 231 lb 6.4 oz (105 kg)   BMI 29.85 kg/m  Gen: NAD, alert, cooperative with exam HEENT: NCAT CV: RRR, good S1/S2, no murmur Resp: CTABL, no wheezes, non-labored Ext: No edema, warm Neuro: Alert and oriented, No gross deficits  Diabetic Foot Exam - Simple   Simple Foot Form Visual Inspection No deformities, no ulcerations, no other skin breakdown bilaterally:  Yes Sensation Testing Intact to touch and monofilament testing bilaterally:  Yes Pulse Check Posterior Tibialis and Dorsalis pulse intact bilaterally:  Yes Comments     Assessment and plan:  # Type 2 diabetes Control slipping, A1c 8.3 today Increasing Onglyza Continue metformin and glipizide Consider adding SGLT2  # Hyperlipidemia Previously untreated Evidence of fatty liver on CT and liver enzymes Labs, consider statin  # Constipation Mild, intermittent Continue stool softeners and metamucil  # HTN Controlled on quinapril, continue labs     Orders Placed This Encounter  Procedures  . Bayer DCA Hb A1c Waived  . CMP14+EGFR  . CBC with Differential/Platelet  . Lipid panel    Meds ordered this encounter  Medications  . Saxagliptin-Metformin 05-998 MG TB24    Sig: Take 1 tablet by mouth daily.    Dispense:  90 tablet    Refill:  [place],  MD [facility] 12/14/2015, 4:28 PM

## 2015-12-14 NOTE — Patient Instructions (Addendum)
Great to see you!  Your liver enzymes have been stable for a for about 1 year, it is probably because of fatty liver  I will consider cholesterol medicine if your numbers are high  I have increased your saxagliptan ( diabetic medication)

## 2015-12-15 LAB — CBC WITH DIFFERENTIAL/PLATELET
BASOS: 0 %
Basophils Absolute: 0 10*3/uL (ref 0.0–0.2)
EOS (ABSOLUTE): 0.3 10*3/uL (ref 0.0–0.4)
EOS: 4 %
HEMATOCRIT: 35.4 % — AB (ref 37.5–51.0)
HEMOGLOBIN: 11.6 g/dL — AB (ref 12.6–17.7)
IMMATURE GRANS (ABS): 0 10*3/uL (ref 0.0–0.1)
IMMATURE GRANULOCYTES: 0 %
Lymphocytes Absolute: 3.5 10*3/uL — ABNORMAL HIGH (ref 0.7–3.1)
Lymphs: 41 %
MCH: 24.9 pg — ABNORMAL LOW (ref 26.6–33.0)
MCHC: 32.8 g/dL (ref 31.5–35.7)
MCV: 76 fL — AB (ref 79–97)
MONOCYTES: 9 %
Monocytes Absolute: 0.7 10*3/uL (ref 0.1–0.9)
NEUTROS PCT: 46 %
Neutrophils Absolute: 4.1 10*3/uL (ref 1.4–7.0)
Platelets: 263 10*3/uL (ref 150–379)
RBC: 4.66 x10E6/uL (ref 4.14–5.80)
RDW: 15 % (ref 12.3–15.4)
WBC: 8.7 10*3/uL (ref 3.4–10.8)

## 2015-12-15 LAB — CMP14+EGFR
ALBUMIN: 4.3 g/dL (ref 3.5–5.5)
ALK PHOS: 51 IU/L (ref 39–117)
ALT: 60 IU/L — ABNORMAL HIGH (ref 0–44)
AST: 50 IU/L — AB (ref 0–40)
Albumin/Globulin Ratio: 1.1 — ABNORMAL LOW (ref 1.2–2.2)
BUN/Creatinine Ratio: 17 (ref 9–20)
BUN: 15 mg/dL (ref 6–24)
Bilirubin Total: 0.2 mg/dL (ref 0.0–1.2)
CALCIUM: 9.4 mg/dL (ref 8.7–10.2)
CO2: 25 mmol/L (ref 18–29)
CREATININE: 0.9 mg/dL (ref 0.76–1.27)
Chloride: 100 mmol/L (ref 96–106)
GFR calc Af Amer: 114 mL/min/{1.73_m2} (ref >59)
GFR, EST NON AFRICAN AMERICAN: 99 mL/min/{1.73_m2} (ref >59)
GLOBULIN, TOTAL: 3.8 g/dL (ref 1.5–4.5)
GLUCOSE: 146 mg/dL — AB (ref 65–99)
Potassium: 4.2 mmol/L (ref 3.5–5.2)
SODIUM: 139 mmol/L (ref 134–144)
Total Protein: 8.1 g/dL (ref 6.0–8.5)

## 2015-12-15 LAB — LIPID PANEL
CHOL/HDL RATIO: 4.2 ratio (ref 0.0–5.0)
Cholesterol, Total: 200 mg/dL — ABNORMAL HIGH (ref 100–199)
HDL: 48 mg/dL (ref >39)
LDL CALC: 134 mg/dL — AB (ref 0–99)
TRIGLYCERIDES: 88 mg/dL (ref 0–149)
VLDL CHOLESTEROL CAL: 18 mg/dL (ref 5–40)

## 2015-12-17 ENCOUNTER — Other Ambulatory Visit: Payer: Self-pay

## 2015-12-17 MED ORDER — ATORVASTATIN CALCIUM 20 MG PO TABS: 20.0000 mg | ORAL_TABLET | Freq: Every day | ORAL | 0 refills | Status: DC

## 2015-12-25 ENCOUNTER — Telehealth: Payer: Self-pay | Admitting: Family Medicine

## 2015-12-25 NOTE — Telephone Encounter (Signed)
Left message for pt to return call.

## 2015-12-27 ENCOUNTER — Other Ambulatory Visit: Payer: Self-pay | Admitting: Family Medicine

## 2016-01-11 ENCOUNTER — Other Ambulatory Visit: Payer: Self-pay | Admitting: Family Medicine

## 2016-01-17 NOTE — Telephone Encounter (Signed)
Patient advised that Dr. Wendi Snipes increased the saxagliptin-metformin 05/998 and he is only supposed to take it one a day.

## 2016-01-30 ENCOUNTER — Ambulatory Visit (INDEPENDENT_AMBULATORY_CARE_PROVIDER_SITE_OTHER): Payer: BLUE CROSS/BLUE SHIELD | Admitting: Family Medicine

## 2016-01-30 ENCOUNTER — Encounter: Payer: Self-pay | Admitting: Family Medicine

## 2016-01-30 VITALS — BP 140/83 | HR 69 | Temp 97.2°F | Ht 73.83 in | Wt 230.0 lb

## 2016-01-30 DIAGNOSIS — E785 Hyperlipidemia, unspecified: Secondary | ICD-10-CM

## 2016-01-30 NOTE — Patient Instructions (Signed)
Great to see you!  Continue the cholesterol medicine, Lipitor.   We will call with labs within 1 week

## 2016-01-30 NOTE — Progress Notes (Signed)
   HPI  Patient presents today for follow-up for hyperlipidemia.  Patient has taken Lipitor daily without problem. He has had a few random concerns including increasing GERD symptoms. He's had some left pectoral soreness which has come and gone. It is responsive to ibuprofen.  He is watching his diet moderately.  He has noticed that he's having increased urination at night, he suspects this might be due to increasing blood sugar concentrations.  He is not fasting today. PMH: Smoking status noted ROS: Per HPI  Objective: BP 140/83   Pulse 69   Temp 97.2 F (36.2 C) (Oral)   Ht 6' 1.83" (1.875 m)   Wt 230 lb (104.3 kg)   BMI 29.67 kg/m  Gen: NAD, alert, cooperative with exam HEENT: NCAT CV: RRR, good S1/S2, no murmur Chest wall: Mild tenderness to palpation of the left pectoral muscle Resp: CTABL, no wheezes, non-labored Ext: No edema, warm Neuro: Alert and oriented, No gross deficits  Assessment and plan:  # Hyperlipidemia Repeat labs today Continue Lipitor, follow-up 3 months Expect improving LDL.   Orders Placed This Encounter  Procedures  . CMP14+EGFR  . LDL Cholesterol, Direct    No orders of the defined types were placed in this encounter.   Laroy Apple, MD [facility] 01/30/2016, 2:24 PM

## 2016-01-31 ENCOUNTER — Ambulatory Visit: Payer: BLUE CROSS/BLUE SHIELD | Admitting: Family Medicine

## 2016-01-31 LAB — CMP14+EGFR
ALBUMIN: 4.7 g/dL (ref 3.5–5.5)
ALT: 51 IU/L — ABNORMAL HIGH (ref 0–44)
AST: 38 IU/L (ref 0–40)
Albumin/Globulin Ratio: 1.3 (ref 1.2–2.2)
Alkaline Phosphatase: 59 IU/L (ref 39–117)
BUN / CREAT RATIO: 18 (ref 9–20)
BUN: 13 mg/dL (ref 6–24)
Bilirubin Total: 0.3 mg/dL (ref 0.0–1.2)
CALCIUM: 10.1 mg/dL (ref 8.7–10.2)
CO2: 26 mmol/L (ref 18–29)
CREATININE: 0.71 mg/dL — AB (ref 0.76–1.27)
Chloride: 97 mmol/L (ref 96–106)
GFR calc non Af Amer: 108 mL/min/{1.73_m2} (ref >59)
GFR, EST AFRICAN AMERICAN: 125 mL/min/{1.73_m2} (ref >59)
GLUCOSE: 122 mg/dL — AB (ref 65–99)
Globulin, Total: 3.7 g/dL (ref 1.5–4.5)
Potassium: 4.3 mmol/L (ref 3.5–5.2)
Sodium: 139 mmol/L (ref 134–144)
TOTAL PROTEIN: 8.4 g/dL (ref 6.0–8.5)

## 2016-01-31 LAB — LDL CHOLESTEROL, DIRECT: LDL Direct: 86 mg/dL (ref 0–99)

## 2016-02-06 ENCOUNTER — Ambulatory Visit (INDEPENDENT_AMBULATORY_CARE_PROVIDER_SITE_OTHER): Payer: BLUE CROSS/BLUE SHIELD

## 2016-02-06 ENCOUNTER — Ambulatory Visit (INDEPENDENT_AMBULATORY_CARE_PROVIDER_SITE_OTHER): Payer: BLUE CROSS/BLUE SHIELD | Admitting: Family Medicine

## 2016-02-06 ENCOUNTER — Encounter: Payer: Self-pay | Admitting: Family Medicine

## 2016-02-06 ENCOUNTER — Encounter (INDEPENDENT_AMBULATORY_CARE_PROVIDER_SITE_OTHER): Payer: Self-pay

## 2016-02-06 VITALS — BP 133/83 | HR 62 | Temp 97.0°F | Ht 73.83 in | Wt 235.0 lb

## 2016-02-06 DIAGNOSIS — M542 Cervicalgia: Secondary | ICD-10-CM

## 2016-02-06 DIAGNOSIS — M4712 Other spondylosis with myelopathy, cervical region: Secondary | ICD-10-CM

## 2016-02-06 HISTORY — DX: Other spondylosis with myelopathy, cervical region: M47.12

## 2016-02-06 MED ORDER — CYCLOBENZAPRINE HCL 10 MG PO TABS: 10.0000 mg | ORAL_TABLET | Freq: Three times a day (TID) | ORAL | 1 refills | Status: DC | PRN

## 2016-02-06 MED ORDER — KETOROLAC TROMETHAMINE 60 MG/2ML IM SOLN
60.0000 mg | Freq: Once | INTRAMUSCULAR | Status: AC
  Administered 2016-02-06: 60 mg via INTRAMUSCULAR

## 2016-02-06 MED ORDER — DICLOFENAC SODIUM 75 MG PO TBEC: 75.0000 mg | DELAYED_RELEASE_TABLET | Freq: Two times a day (BID) | ORAL | 2 refills | Status: DC

## 2016-02-06 NOTE — Progress Notes (Signed)
Subjective:  Patient ID: Kenneth Stone, male    DOB: 1965-04-15  Age: 51 y.o. MRN: [id number]  CC: Neck Pain (pt here today c/o right side neck pain that radiates down shoulder and right arm especialy with movement of the arm or turning of the head.)   HPI Kenneth Stone presents for Symptoms as noted above starting last night. He did have to lift some tile into a dumpster yesterday. The pain is located at the right posterior base of the neck. It radiates across the superior border of the right shoulder and down the right arm past the elbow and to the wrist. There is no numbness but he has some tingling. The pain is 10/10 and he cannot get comfortable regardless of position. He cannot relax to sleep. He was awake most of the night. Ibuprofen gave a little bit of relief at first ring the night. The pain is primarily a dull ache.   History Kenneth Stone has a past medical history of Diabetes mellitus without complication ([place]); Essential hypertension (09/05/2014); GERD (gastroesophageal reflux disease); Hyperlipidemia (12/31/2014); and Spondylosis, cervical, 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   He has no past surgical history on file.   His family history includes Cancer in his mother; Heart disease in his father.He reports that he has never smoked. He has never used smokeless tobacco. He reports that he drinks about 1.2 oz of alcohol per week . He reports that he does not use drugs.    ROS Review of Systems  Constitutional: Negative for chills, diaphoresis and fever.  HENT: Negative for rhinorrhea and sore throat.   Respiratory: Negative for cough and shortness of breath.   Cardiovascular: Negative for chest pain.  Gastrointestinal: Negative for abdominal pain.  Musculoskeletal: Positive for arthralgias, back pain, myalgias and neck pain.  Skin: Negative for rash.  Neurological: Negative for weakness and headaches.    Objective:  BP 133/83   Pulse 62   Temp 97 F (36.1 C) (Oral)   Ht 6' 1.83" (1.875  m)   Wt 235 lb (106.6 kg)   BMI 30.31 kg/m   BP Readings from Last 3 Encounters:  02/06/16 133/83  01/30/16 140/83  12/14/15 133/84    Wt Readings from Last 3 Encounters:  02/06/16 235 lb (106.6 kg)  01/30/16 230 lb (104.3 kg)  12/14/15 231 lb 6.4 oz (105 kg)     Physical Exam  Constitutional: He is oriented to person, place, and time. He appears well-developed and well-nourished. He appears distressed.  HENT:  Head: Normocephalic and atraumatic.  Right Ear: Tympanic membrane and external ear normal. No decreased hearing is noted.  Left Ear: Tympanic membrane and external ear normal. No decreased hearing is noted.  Mouth/Throat: No oropharyngeal exudate or posterior oropharyngeal erythema.  Eyes: Pupils are equal, round, and reactive to light.  Neck: Normal range of motion. Neck supple.  Cardiovascular: Normal rate, regular rhythm and normal heart sounds.   No murmur heard. Pulmonary/Chest: Effort normal and breath sounds normal. No respiratory distress.  Abdominal: Soft. Bowel sounds are normal. He exhibits no mass. There is no tenderness.  Musculoskeletal: He exhibits tenderness (at the posterior neck at the nuchal ridge and inferiorly into the sperior trapezius).  Neurological: He is alert and oriented to person, place, and time. He has normal reflexes.  Skin: Skin is warm and dry.  Psychiatric: His behavior is normal. Thought content normal.  Vitals reviewed.    Lab Results  Component Value Date   WBC 8.7 12/14/2015   HCT 35.4 (  L) 12/14/2015   PLT 263 12/14/2015   GLUCOSE 122 (H) 01/30/2016   CHOL 200 (H) 12/14/2015   TRIG 88 12/14/2015   HDL 48 12/14/2015   LDLDIRECT 86 01/30/2016   LDLCALC 134 (H) 12/14/2015   ALT 51 (H) 01/30/2016   AST 38 01/30/2016   NA 139 01/30/2016   K 4.3 01/30/2016   CL 97 01/30/2016   CREATININE 0.71 (L) 01/30/2016   BUN 13 01/30/2016   CO2 26 01/30/2016   HGBA1C 7.4 12/28/2014   MICROALBUR negaitve 09/05/2014    Ct Abdomen  Pelvis W Wo Contrast  Result Date: 06/05/2015 CLINICAL DATA:  Hematuria for 3 months. EXAM: CT ABDOMEN AND PELVIS WITHOUT AND WITH CONTRAST TECHNIQUE: Multidetector CT imaging of the abdomen and pelvis was performed following the standard protocol before and following the bolus administration of intravenous contrast. CONTRAST:  137mL ISOVUE-300 IOPAMIDOL (ISOVUE-300) INJECTION 61% COMPARISON:  None. FINDINGS: Lower chest:  No acute findings. Hepatobiliary: Mild to moderate diffuse hepatic steatosis noted. No liver masses identified. Gallbladder is unremarkable. Pancreas: No mass, inflammatory changes, or other significant abnormality. Spleen: Within normal limits in size and appearance. Adrenals/Urinary Tract: No adrenal masses identified. No evidence of urolithiasis or hydronephrosis. No complex cystic or solid renal masses are identified. No masses seen involving the lower urinary tract on delayed urographic phase. Stomach/Bowel: No evidence of obstruction, inflammatory process, or abnormal fluid collections. Vascular/Lymphatic: No pathologically enlarged lymph nodes. No evidence of abdominal aortic aneurysm. Aortic atherosclerotic calcification noted. Reproductive: Normal size prostate.  Symmetric seminal vesicles. Other: None. Musculoskeletal:  No suspicious bone lesions identified. IMPRESSION: No radiographic evidence of urinary tract carcinoma, urolithiasis, or hydronephrosis. Hepatic steatosis incidentally noted. Electronically Signed   By: Earle Gell M.D.   On: 06/05/2015 17:47    Assessment & Plan:   Kenneth Stone was seen today for neck pain.  Diagnoses and all orders for this visit:  Spondylosis, cervical, with myelopathy  Cervicalgia -     DG Cervical Spine Complete; Future -     ketorolac (TORADOL) injection 60 mg; Inject 2 mLs (60 mg total) into the muscle once.  Other orders -     cyclobenzaprine (FLEXERIL) 10 MG tablet; Take 1 tablet (10 mg total) by mouth 3 (three) times daily as needed  for muscle spasms. -     diclofenac (VOLTAREN) 75 MG EC tablet; Take 1 tablet (75 mg total) by mouth 2 (two) times daily. For muscle and  Joint pain   c-spine XR shows moderately severe spondylosis  I am having Kenneth Stone start on cyclobenzaprine and diclofenac. I am also having him maintain his glucose blood, JOBST FOR MEN KNEE HIGH/LG, glipiZIDE, Saxagliptin-Metformin, atorvastatin, pantoprazole, and quinapril. We administered ketorolac.  Meds ordered this encounter  Medications  . ketorolac (TORADOL) injection 60 mg  . cyclobenzaprine (FLEXERIL) 10 MG tablet    Sig: Take 1 tablet (10 mg total) by mouth 3 (three) times daily as needed for muscle spasms.    Dispense:  90 tablet    Refill:  1  . diclofenac (VOLTAREN) 75 MG EC tablet    Sig: Take 1 tablet (75 mg total) by mouth 2 (two) times daily. For muscle and  Joint pain    Dispense:  60 tablet    Refill:  2     Follow-up: Return in about 2 weeks (around 02/20/2016).  Claretta Fraise, M.D.

## 2016-02-11 ENCOUNTER — Ambulatory Visit (INDEPENDENT_AMBULATORY_CARE_PROVIDER_SITE_OTHER): Payer: BLUE CROSS/BLUE SHIELD | Admitting: Family Medicine

## 2016-02-11 ENCOUNTER — Encounter: Payer: Self-pay | Admitting: Family Medicine

## 2016-02-11 VITALS — BP 131/73 | HR 79 | Temp 97.3°F | Ht 73.83 in | Wt 234.6 lb

## 2016-02-11 DIAGNOSIS — M4712 Other spondylosis with myelopathy, cervical region: Secondary | ICD-10-CM

## 2016-02-11 DIAGNOSIS — M542 Cervicalgia: Secondary | ICD-10-CM

## 2016-02-11 MED ORDER — PREDNISONE 10 MG PO TABS: ORAL_TABLET | ORAL | 0 refills | Status: DC

## 2016-02-11 MED ORDER — TRAMADOL HCL 50 MG PO TABS: 50.0000 mg | ORAL_TABLET | Freq: Three times a day (TID) | ORAL | 0 refills | Status: DC | PRN

## 2016-02-11 NOTE — Progress Notes (Signed)
   HPI  Patient presents today here to follow-up for neck pain.  Patient was seen on January 10, one day after he had acute onset right-sided neck pain after lifting tile into a dumpster. The pain continues to radiate from the midline right neck all the way down to his first through third fingers. He complains of numbness on the first through third fingers.  He denies any arm weakness or loss of strength.  He was treated with NSAIDs and Flexeril which have not improved his symptoms.  He states that it's difficult to brush his teeth, get dressed, sleep, or do any activities except for walk. He denies any leg weakness or difficulty walking.  PMH: Smoking status noted ROS: Per HPI  Objective: BP 131/73   Pulse 79   Temp 97.3 F (36.3 C) (Oral)   Ht 6' 1.83" (1.875 m)   Wt 234 lb 9.6 oz (106.4 kg)   BMI 30.26 kg/m  Gen: NAD, alert, cooperative with exam HEENT: NCAT CV: RRR, good S1/S2, no murmur Resp: CTABL, no wheezes, non-labored Ext: No edema, warm Neuro: Alert and oriented, strength 5/5 and sensation intact in bilateral grip and flexion and extension of upper extremities. Flexion of the lower right arm causes reproduction of pain MSK mild tenderness to palpation of lower cervical spine and paraspinal muscles Describes numbness of R thumb and first finger  Assessment and plan:  # Acute neck pain, cervical spondylosis with myelopathy Radicular radiation of pain in C6 distribution possibly affecting C 7 as well Prednisone course with caution- diabetes Tramadol, continue flexeril if needed MRI if no improvement vs surgery referral ( consider [facility] at [place])  Discussed no driving after tramadol  Meds ordered this encounter  Medications  . predniSONE (DELTASONE) 10 MG tablet    Sig: Take 4 pills a day for 3 days, then 3 pills a day for 3 days, then 2 pills a day for 3 days, then 1 pill a day for 3 days, then stop    Dispense:  30 tablet    Refill:  0  .  traMADol (ULTRAM) 50 MG tablet    Sig: Take 1 tablet (50 mg total) by mouth every 8 (eight) hours as needed.    Dispense:  30 tablet    Refill:  0    Laroy Apple, MD [facility] 02/11/2016, 9:22 AM

## 2016-02-11 NOTE — Patient Instructions (Signed)
Great to see you!  Start prednisone today for the pain, the effects of the medicine will set in over the next 12-24 hours.   Try tramadol for night time pain, it is a narcotic similar to morphine so do not drive after you take it.   If you are not better in 1 week or if you are losing strength in your arm please come back. We can then pursue an MRI or send you to a surgeon if needed.

## 2016-02-20 ENCOUNTER — Ambulatory Visit (INDEPENDENT_AMBULATORY_CARE_PROVIDER_SITE_OTHER): Payer: BLUE CROSS/BLUE SHIELD | Admitting: Pediatrics

## 2016-02-20 ENCOUNTER — Encounter: Payer: Self-pay | Admitting: Pediatrics

## 2016-02-20 VITALS — BP 133/84 | HR 70 | Temp 97.6°F | Ht 73.83 in | Wt 228.2 lb

## 2016-02-20 DIAGNOSIS — G629 Polyneuropathy, unspecified: Secondary | ICD-10-CM

## 2016-02-20 DIAGNOSIS — M792 Neuralgia and neuritis, unspecified: Secondary | ICD-10-CM | POA: Diagnosis not present

## 2016-02-20 MED ORDER — GABAPENTIN 300 MG PO CAPS: 300.0000 mg | ORAL_CAPSULE | Freq: Two times a day (BID) | ORAL | 2 refills | Status: DC

## 2016-02-20 NOTE — Patient Instructions (Signed)
aspercream for arm pain

## 2016-02-20 NOTE — Progress Notes (Signed)
  Subjective:   Patient ID: Kenneth Stone, male    DOB: Aug 17, 1965, 51 y.o.   MRN: [id number] CC: Arm Pain (right)  HPI: Kenneth Stone is a 51 y.o. 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 Allergies and medications reviewed and updated. History  Smoking Status  . Never Smoker  Smokeless Tobacco  . Never Used   ROS: Per HPI   Objective:    BP 133/84   Pulse 70   Temp 97.6 F (36.4 C) (Oral)   Ht 6' 1.83" (1.875 m)   Wt 228 lb 3.2 oz (103.5 kg)   BMI 29.43 kg/m   Wt Readings from Last 3 Encounters:  02/20/16 228 lb 3.2 oz (103.5 kg)  02/11/16 234 lb 9.6 oz (106.4 kg)  02/06/16 235 lb (106.6 kg)    Gen: NAD, alert, cooperative with exam, NCAT EYES: EOMI, no conjunctival injection, or no icterus CV: distal pulses 2+ b/l  Resp:  normal WOB Ext: No edema, warm Neuro: Alert and oriented, numb to touch flexor surface 1st, 2nd fingers finger tips only MSK: no TTP over cervical spine, R arm normal to inspection, no redness or swelling, 5/5 strength b/l biceps, triceps, hand grip No TTP over R shoulder joint Pain with palpation over R upper arm soft tissue  Assessment & Plan:  Kenneth Stone was seen today for arm pain.  Diagnoses and all orders for this visit:  Neuropathy  ([place]) Trial of below for pain Cont NSAIDs, rest, can try aspercream -     gabapentin (NEURONTIN) 300 MG capsule; Take 1 capsule (300 mg total) by mouth 2 (two) times daily.  Radicular pain in right arm Pain preventing him from working, constant all the time No improvement since starting -     [place]; Future   Follow up plan: Return in about 2 weeks (around 03/05/2016) for to see Dr Wendi Snipes. Assunta Found, MD [place]

## 2016-02-22 ENCOUNTER — Telehealth: Payer: Self-pay | Admitting: Family Medicine

## 2016-02-23 NOTE — Telephone Encounter (Signed)
In process 

## 2016-03-04 ENCOUNTER — Telehealth: Payer: Self-pay | Admitting: Family Medicine

## 2016-03-04 DIAGNOSIS — M5410 Radiculopathy, site unspecified: Secondary | ICD-10-CM | POA: Insufficient documentation

## 2016-03-04 NOTE — Telephone Encounter (Signed)
Lets get him to ortho considering his persistent pain.   Refer to orthopedics.   Laroy Apple, MD [facility] 03/04/2016, 4:23 PM

## 2016-03-04 NOTE — Telephone Encounter (Signed)
lmtcb

## 2016-03-10 DIAGNOSIS — M5412 Radiculopathy, cervical region: Secondary | ICD-10-CM | POA: Diagnosis not present

## 2016-03-17 ENCOUNTER — Ambulatory Visit: Payer: BLUE CROSS/BLUE SHIELD | Admitting: Family Medicine

## 2016-03-31 ENCOUNTER — Other Ambulatory Visit: Payer: Self-pay | Admitting: Family Medicine

## 2016-03-31 DIAGNOSIS — E119 Type 2 diabetes mellitus without complications: Secondary | ICD-10-CM

## 2016-04-17 DIAGNOSIS — E119 Type 2 diabetes mellitus without complications: Secondary | ICD-10-CM | POA: Diagnosis not present

## 2016-04-17 DIAGNOSIS — H40013 Open angle with borderline findings, low risk, bilateral: Secondary | ICD-10-CM | POA: Diagnosis not present

## 2016-04-17 DIAGNOSIS — Z7984 Long term (current) use of oral hypoglycemic drugs: Secondary | ICD-10-CM | POA: Diagnosis not present

## 2016-04-24 ENCOUNTER — Other Ambulatory Visit: Payer: Self-pay | Admitting: Family Medicine

## 2016-04-29 ENCOUNTER — Ambulatory Visit (INDEPENDENT_AMBULATORY_CARE_PROVIDER_SITE_OTHER): Payer: BLUE CROSS/BLUE SHIELD | Admitting: Family Medicine

## 2016-04-29 ENCOUNTER — Encounter: Payer: Self-pay | Admitting: Family Medicine

## 2016-04-29 VITALS — BP 129/82 | HR 75 | Temp 98.5°F | Ht 73.83 in | Wt 228.4 lb

## 2016-04-29 DIAGNOSIS — J209 Acute bronchitis, unspecified: Secondary | ICD-10-CM | POA: Diagnosis not present

## 2016-04-29 DIAGNOSIS — E785 Hyperlipidemia, unspecified: Secondary | ICD-10-CM | POA: Diagnosis not present

## 2016-04-29 DIAGNOSIS — E119 Type 2 diabetes mellitus without complications: Secondary | ICD-10-CM | POA: Diagnosis not present

## 2016-04-29 LAB — BAYER DCA HB A1C WAIVED: HB A1C: 9.6 % — AB (ref <7.0)

## 2016-04-29 MED ORDER — AMOXICILLIN-POT CLAVULANATE 875-125 MG PO TABS: 1.0000 | ORAL_TABLET | Freq: Two times a day (BID) | ORAL | 0 refills | Status: DC

## 2016-04-29 NOTE — Progress Notes (Signed)
   HPI  Patient presents today here with cough.  Patient explains that over the last 2-3 days he's had cough, sore throat, right ear pain, and difficulty sleeping because of it.  He began having some muscle aches. He has subjective fever.  He is tolerating food and fluids like usual.  Patient has had difficulty with diabetes lately patient states that his fasting blood sugars around 150-200, he states that he frequently has to 50s and low 300s after eating.  PMH: Smoking status noted ROS: Per HPI  Objective: BP 129/82   Pulse 75   Temp 98.5 F (36.9 C) (Oral)   Ht 6' 1.83" (1.875 m)   Wt 228 lb 6.4 oz (103.6 kg)   BMI 29.46 kg/m  Gen: NAD, alert, cooperative with exam HEENT: NCAT oropharynx clear, nares clear, TMs normal bilaterally CV: RRR, good S1/S2, no murmur Resp: CTABL, no wheezes, non-labored Ext: No edema, warm Neuro: Alert and oriented, No gross deficits  Assessment and plan:  # Acute bronchitis Patient with cough, sore throat. Patient also with subjective fever, concern for developing pneumonia. Treat with Augmentin I believe these had a decline in his diabetic control some treating more aggressively  # Type 2 diabetes Uncontrolled fasting blood sugars, A1c on the way out today, changing follow-up for about 2 weeks for from now. Consider starting farxiga  # HLD Started statin in Nov, Repeat CMP LDL    Orders Placed This Encounter  Procedures  . Bayer DCA Hb A1c Waived  . CMP14+EGFR  . LDL Cholesterol, Direct    Meds ordered this encounter  Medications  . amoxicillin-clavulanate (AUGMENTIN) 875-125 MG tablet    Sig: Take 1 tablet by mouth 2 (two) times daily.    Dispense:  20 tablet    Refill:  0    Laroy Apple, MD [facility] 04/29/2016, 3:12 PM

## 2016-04-29 NOTE — Patient Instructions (Signed)
Great to see you!  Take Augmentin starting today  We will call with labs within 1 week  Lets change your follow up to 2 weeks form now.

## 2016-04-30 LAB — CMP14+EGFR
A/G RATIO: 1.2 (ref 1.2–2.2)
ALT: 37 IU/L (ref 0–44)
AST: 28 IU/L (ref 0–40)
Albumin: 4.3 g/dL (ref 3.5–5.5)
Alkaline Phosphatase: 54 IU/L (ref 39–117)
BILIRUBIN TOTAL: 0.3 mg/dL (ref 0.0–1.2)
BUN/Creatinine Ratio: 14 (ref 9–20)
BUN: 11 mg/dL (ref 6–24)
CHLORIDE: 99 mmol/L (ref 96–106)
CO2: 23 mmol/L (ref 18–29)
Calcium: 9.5 mg/dL (ref 8.7–10.2)
Creatinine, Ser: 0.78 mg/dL (ref 0.76–1.27)
GFR calc Af Amer: 120 mL/min/{1.73_m2} (ref >59)
GFR calc non Af Amer: 104 mL/min/{1.73_m2} (ref >59)
GLOBULIN, TOTAL: 3.6 g/dL (ref 1.5–4.5)
Glucose: 148 mg/dL — ABNORMAL HIGH (ref 65–99)
Potassium: 4.3 mmol/L (ref 3.5–5.2)
SODIUM: 139 mmol/L (ref 134–144)
Total Protein: 7.9 g/dL (ref 6.0–8.5)

## 2016-04-30 LAB — LDL CHOLESTEROL, DIRECT: LDL Direct: 77 mg/dL (ref 0–99)

## 2016-05-01 ENCOUNTER — Other Ambulatory Visit: Payer: Self-pay | Admitting: Family Medicine

## 2016-05-01 ENCOUNTER — Telehealth: Payer: Self-pay | Admitting: Family Medicine

## 2016-05-01 ENCOUNTER — Ambulatory Visit: Payer: BLUE CROSS/BLUE SHIELD | Admitting: Family Medicine

## 2016-05-01 MED ORDER — CANAGLIFLOZIN 100 MG PO TABS: 100.0000 mg | ORAL_TABLET | Freq: Every day | ORAL | 1 refills | Status: DC

## 2016-05-06 ENCOUNTER — Ambulatory Visit (INDEPENDENT_AMBULATORY_CARE_PROVIDER_SITE_OTHER): Payer: BLUE CROSS/BLUE SHIELD | Admitting: Family Medicine

## 2016-05-06 ENCOUNTER — Encounter: Payer: Self-pay | Admitting: Family Medicine

## 2016-05-06 VITALS — BP 137/83 | HR 78 | Temp 98.1°F | Ht 73.83 in | Wt 231.4 lb

## 2016-05-06 DIAGNOSIS — R059 Cough, unspecified: Secondary | ICD-10-CM

## 2016-05-06 DIAGNOSIS — R05 Cough: Secondary | ICD-10-CM | POA: Diagnosis not present

## 2016-05-06 DIAGNOSIS — H6982 Other specified disorders of Eustachian tube, left ear: Secondary | ICD-10-CM

## 2016-05-06 DIAGNOSIS — W57XXXA Bitten or stung by nonvenomous insect and other nonvenomous arthropods, initial encounter: Secondary | ICD-10-CM

## 2016-05-06 NOTE — Progress Notes (Addendum)
   HPI  Patient presents today here with continued ear pain, cough, and a tick bite.  Tick bite Tic was present for about 24 hours, it has been 2-3 days since he found it. The area is healing well, no rash. No fever, chills, sweats.  Cough Patient states he's had cough for 3 days, he previously had sore throat treated with Augmentin due to associated ear pain as well. The ear pain has continued as well. He is hearing normally out of the year. Again no fever, chills, sweats. Normal breathing. Tolerating food and fluids normally. No other medications besides Augmentin.  Patient has been taking Protonix for about one year.  PMH: Smoking status noted ROS: Per HPI  Objective: BP 137/83   Pulse 78   Temp 98.1 F (36.7 C) (Oral)   Ht 6' 1.83" (1.875 m)   Wt 231 lb 6.4 oz (105 kg)   BMI 29.85 kg/m  Gen: NAD, alert, cooperative with exam HEENT: NCAT, TMs normal bilaterally, nares with erythema and boggy mucosa, oropharynx clear CV: RRR, good S1/S2, no murmur Resp: CTABL, no wheezes, non-labored Ext: No edema, warm Neuro: Alert and oriented, No gross deficits  Skin:  R ASIS area with two small heme crusted lesions, mild local erythema,  No warmth, or fluctuance. No drainage  Assessment and plan:  # Eustachian tube dysfunction, post nasal drip Most likely explanation for his ear pain, along with allergic rhinitis causing cough and post nasal drip Consider GERD related cough, unlikely as he is on PPI Start Zyrtec daily, also add Flonase  Finish Augmentin  # Tick bite Low risk exposure, discussed red flags and reasons to be concerned for RMSF. We are not in an endemic area for Lyme disease.   Laroy Apple, MD [facility] 05/06/2016, 3:29 PM

## 2016-05-06 NOTE — Patient Instructions (Signed)
Great to see you!  Start plain zyrtec 1 pills once daily Also start flonase ( generic is ok) 2 sprays per nostril once daily.

## 2016-05-12 NOTE — Telephone Encounter (Signed)
Patient has been seen since phone call.  This encounter will now be closed 

## 2016-05-15 ENCOUNTER — Ambulatory Visit (INDEPENDENT_AMBULATORY_CARE_PROVIDER_SITE_OTHER): Payer: BLUE CROSS/BLUE SHIELD | Admitting: Family Medicine

## 2016-05-15 ENCOUNTER — Encounter: Payer: Self-pay | Admitting: Family Medicine

## 2016-05-15 VITALS — BP 134/84 | HR 79 | Temp 97.6°F | Ht 73.83 in | Wt 227.0 lb

## 2016-05-15 DIAGNOSIS — R0982 Postnasal drip: Secondary | ICD-10-CM

## 2016-05-15 DIAGNOSIS — E119 Type 2 diabetes mellitus without complications: Secondary | ICD-10-CM

## 2016-05-15 DIAGNOSIS — J029 Acute pharyngitis, unspecified: Secondary | ICD-10-CM

## 2016-05-15 MED ORDER — SAXAGLIPTIN-METFORMIN ER 2.5-1000 MG PO TB24: 1.0000 | ORAL_TABLET | Freq: Two times a day (BID) | ORAL | 5 refills | Status: DC

## 2016-05-15 MED ORDER — DOXYCYCLINE HYCLATE 100 MG PO TABS: 100.0000 mg | ORAL_TABLET | Freq: Two times a day (BID) | ORAL | 0 refills | Status: DC

## 2016-05-15 NOTE — Patient Instructions (Signed)
Great to see you!  Start zyrtec 1 pill once daily, I have also sent another round of antibiotics  For diabetes Take glipizide, invokana, and saxagliptin/metformin ( I have changed this to twice daily)

## 2016-05-15 NOTE — Progress Notes (Signed)
   HPI  Patient presents today with continued sore throat.  Diabetes Patient is tolerating invokanawell Blood sugar improving Has noticed increased urination and thirst  Still has sore throat Helped some by benadryl, has not tried zyrtec ( thought it was like PPI) Does have frequent throat clearing  Cough improved.   PMH: Smoking status noted ROS: Per HPI  Objective: BP 134/84   Pulse 79   Temp 97.6 F (36.4 C) (Oral)   Ht 6' 1.83" (1.875 m)   Wt 227 lb (103 kg)   BMI 29.28 kg/m  Gen: NAD, alert, cooperative with exam HEENT: NCAT, Tms WNL BL, nares clear, oropharnyx clear CV: RRR, good S1/S2, no murmur Resp: CTABL, no wheezes, non-labored Ext: No edema, warm Neuro: Alert and oriented, No gross deficits  Assessment and plan:  # pharyngitis Unusual, almost 3 weeks of symps now Augmentin given wthout improvement, requesting another abx- given doxy to change coverage Emphasized zyrtec  # post nasal drip Allergic, helped some by benadryl Discussed zyrtec, could be etiology of sore throat  # T2DM Change onglyzamet to 2.5mg /1000mg  BID Continue glipizide + invokana  Meds ordered this encounter  Medications  . doxycycline (VIBRA-TABS) 100 MG tablet    Sig: Take 1 tablet (100 mg total) by mouth 2 (two) times daily. 1 po bid    Dispense:  20 tablet    Refill:  0  . Saxagliptin-Metformin 2.05-998 MG TB24    Sig: Take 1 tablet by mouth 2 (two) times daily.    Dispense:  60 tablet    Refill:  [place], MD [facility] 05/15/2016, 3:42 PM

## 2016-05-28 ENCOUNTER — Other Ambulatory Visit: Payer: Self-pay

## 2016-05-28 MED ORDER — CANAGLIFLOZIN 100 MG PO TABS: 100.0000 mg | ORAL_TABLET | Freq: Every day | ORAL | 0 refills | Status: DC

## 2016-06-05 DIAGNOSIS — K080 Exfoliation of teeth due to systemic causes: Secondary | ICD-10-CM | POA: Diagnosis not present

## 2016-06-27 ENCOUNTER — Other Ambulatory Visit: Payer: Self-pay | Admitting: Family Medicine

## 2016-06-27 DIAGNOSIS — E119 Type 2 diabetes mellitus without complications: Secondary | ICD-10-CM

## 2016-07-25 ENCOUNTER — Ambulatory Visit (INDEPENDENT_AMBULATORY_CARE_PROVIDER_SITE_OTHER): Payer: BLUE CROSS/BLUE SHIELD

## 2016-07-25 ENCOUNTER — Ambulatory Visit (INDEPENDENT_AMBULATORY_CARE_PROVIDER_SITE_OTHER): Payer: BLUE CROSS/BLUE SHIELD | Admitting: Family

## 2016-07-25 ENCOUNTER — Other Ambulatory Visit: Payer: Self-pay | Admitting: Family Medicine

## 2016-07-25 ENCOUNTER — Encounter: Payer: Self-pay | Admitting: Family

## 2016-07-25 VITALS — BP 126/81 | HR 79 | Temp 99.4°F | Ht 74.0 in | Wt 223.6 lb

## 2016-07-25 DIAGNOSIS — M25512 Pain in left shoulder: Secondary | ICD-10-CM

## 2016-07-25 DIAGNOSIS — M7582 Other shoulder lesions, left shoulder: Secondary | ICD-10-CM

## 2016-07-25 MED ORDER — NAPROXEN 500 MG PO TABS: 500.0000 mg | ORAL_TABLET | Freq: Two times a day (BID) | ORAL | 1 refills | Status: DC

## 2016-07-25 MED ORDER — KETOROLAC TROMETHAMINE 60 MG/2ML IM SOLN
60.0000 mg | Freq: Once | INTRAMUSCULAR | Status: AC
  Administered 2016-07-25: 60 mg via INTRAMUSCULAR

## 2016-07-25 MED ORDER — METHYLPREDNISOLONE ACETATE 80 MG/ML IJ SUSP
40.0000 mg | Freq: Once | INTRAMUSCULAR | Status: AC
  Administered 2016-07-25: 40 mg via INTRAMUSCULAR

## 2016-07-25 MED ORDER — CYCLOBENZAPRINE HCL 10 MG PO TABS: 10.0000 mg | ORAL_TABLET | Freq: Three times a day (TID) | ORAL | 0 refills | Status: DC | PRN

## 2016-07-25 NOTE — Patient Instructions (Signed)
Rotator Cuff Tendinitis Rotator cuff tendinitis is inflammation of the tough, cord-like bands that connect muscle to bone (tendons) in the rotator cuff. The rotator cuff includes all of the muscles and tendons that connect the arm to the shoulder. The rotator cuff holds the head of the upper arm bone (humerus) in the cup (fossa) of the shoulder blade (scapula). This condition can lead to a long-lasting (chronic) tear. The tear may be partial or complete. What are the causes? This condition is usually caused by overusing the rotator cuff. What increases the risk? This condition is more likely to develop in athletes and workers who frequently use their shoulder or reach over their heads. This can include activities such as:  Tennis.  Baseball or softball.  Swimming.  Construction work.  Painting.  What are the signs or symptoms? Symptoms of this condition include:  Pain spreading (radiating) from the shoulder to the upper arm.  Swelling and tenderness in front of the shoulder.  Pain when reaching, pulling, or lifting the arm above the head.  Pain when lowering the arm from above the head.  Minor pain in the shoulder when resting.  Increased pain in the shoulder at night.  Difficulty placing the arm behind the back.  How is this diagnosed? This condition is diagnosed with a medical history and physical exam. Tests may also be done, including:  X-rays.  MRI.  Ultrasounds.  CT or MR arthrogram. During this test, a contrast material is injected and then images are taken.  How is this treated? Treatment for this condition depends on the severity of the condition. In less severe cases, treatment may include:  Rest. This may be done with a sling that holds the shoulder still (immobilization). Your health care provider may also recommend avoiding activities that involve lifting your arm over your head.  Icing the shoulder.  Anti-inflammatory medicines, such as aspirin or  ibuprofen.  In more severe cases, treatment may include:  Physical therapy.  Steroid injections.  Surgery.  Follow these instructions at home: If you have a sling:  Wear the sling as told by your health care provider. Remove it only as told by your health care provider.  Loosen the sling if your fingers tingle, become numb, or turn cold and blue.  Keep the sling clean.  If the sling is not waterproof, do not let it get wet. Remove it, if allowed, or cover it with a watertight covering when you take a bath or shower. Managing pain, stiffness, and swelling  If directed, put ice on the injured area. ? If you have a removable sling, remove it as told by your health care provider. ? Put ice in a plastic bag. ? Place a towel between your skin and the bag. ? Leave the ice on for 20 minutes, 2-3 times a day.  Move your fingers often to avoid stiffness and to lessen swelling.  Raise (elevate) the injured area above the level of your heart while you are lying down.  Find a comfortable sleeping position or sleep on a recliner, if available. Driving  Do not drive or use heavy machinery while taking prescription pain medicine.  Ask your health care provider when it is safe to drive if you have a sling on your arm. Activity  Rest your shoulder as told by your health care provider.  Return to your normal activities as told by your health care provider. Ask your health care provider what activities are safe for you.  Do any   exercises or stretches as told by your health care provider.  If you do repetitive overhead tasks, take small breaks in between and include stretching exercises as told by your health care provider. General instructions  Do not use any products that contain nicotine or tobacco, such as cigarettes and e-cigarettes. These can delay healing. If you need help quitting, ask your health care provider.  Take over-the-counter and prescription medicines only as told by  your health care provider.  Keep all follow-up visits as told by your health care provider. This is important. Contact a health care provider if:  Your pain gets worse.  You have new pain in your arm, hands, or fingers.  Your pain is not relieved with medicine or does not get better after 6 weeks of treatment.  You have cracking sensations when moving your shoulder in certain directions.  You hear a snapping sound after using your shoulder, followed by severe pain and weakness. Get help right away if:  Your arm, hand, or fingers are numb or tingling.  Your arm, hand, or fingers are swollen or painful or they turn white or blue. Summary  Rotator cuff tendinitis is inflammation of the tough, cord-like bands that connect muscle to bone (tendons) in the rotator cuff.  This condition is usually caused by overusing the rotator cuff, which includes all of the muscles and tendons that connect the arm to the shoulder.  This condition is more likely to develop in athletes and workers who frequently use their shoulder or reach over their heads.  Treatment generally includes rest, anti-inflammatory medicines, and icing. In some cases, physical therapy and steroid injections may be needed. In severe cases, surgery may be needed. This information is not intended to replace advice given to you by your health care provider. Make sure you discuss any questions you have with your health care provider. Document Released: 04/05/2003 Document Revised: 12/31/2015 Document Reviewed: 12/31/2015 Elsevier Interactive Patient Education  2017 [employer]  

## 2016-07-25 NOTE — Progress Notes (Signed)
   Subjective:    Patient ID: Kenneth Stone, male    DOB: Aug 20, 1965, 51 y.o.   MRN: [id number]  Pt presents to the office today with left shoulder pain that started yesterday. Denies any injury. He is requesting x-ray today.  Shoulder Pain   The pain is present in the left shoulder. This is a new problem. The current episode started yesterday. There has been no history of extremity trauma. The problem occurs constantly. The problem has been unchanged. The quality of the pain is described as aching. The pain is at a severity of 8/10. The pain is moderate. Associated symptoms include a limited range of motion and stiffness. Pertinent negatives include no inability to bear weight, numbness or tingling. The symptoms are aggravated by activity. He has tried nothing for the symptoms. The treatment provided no relief.      Review of Systems  Musculoskeletal: Positive for stiffness.       Left shoulder pain  Neurological: Negative for tingling and numbness.  All other systems reviewed and are negative.      Objective:   Physical Exam  Constitutional: He is oriented to person, place, and time. He appears well-developed and well-nourished. No distress.  HENT:  Head: Normocephalic.  Eyes: Pupils are equal, round, and reactive to light. Right eye exhibits no discharge. Left eye exhibits no discharge.  Neck: Normal range of motion. Neck supple. No thyromegaly present.  Cardiovascular: Normal rate, regular rhythm, normal heart sounds and intact distal pulses.   No murmur heard. Pulmonary/Chest: Effort normal and breath sounds normal. No respiratory distress. He has no wheezes.  Abdominal: Soft. Bowel sounds are normal. He exhibits no distension. There is no tenderness.  Musculoskeletal: He exhibits no edema or tenderness.   limited ROM Left shoulder, pain with abduction, unable lift greater than 40 degrees   Neurological: He is alert and oriented to person, place, and time.  Skin: Skin is warm and  dry. No rash noted. No erythema.  Psychiatric: He has a normal mood and affect. His behavior is normal. Judgment and thought content normal.  Vitals reviewed.     BP 126/81   Pulse 79   Temp 99.4 F (37.4 C) (Oral)   Ht 6\' 2"  (1.88 m)   Wt 223 lb 9.6 oz (101.4 kg)   BMI 28.71 kg/m      Assessment & Plan:  1. Acute pain of left shoulder - DG Shoulder Left; Future  2. Rotator cuff tendinitis, left -Rest Ice  As needed Take naprosyn BID with food for next 5 days Low carb diet!!! RTO prn and keep follow up with PCP - DG Shoulder Left; Future - methylPREDNISolone acetate (DEPO-MEDROL) injection 40 mg; Inject 0.5 mLs (40 mg total) into the muscle once. - ketorolac (TORADOL) injection 60 mg; Inject 2 mLs (60 mg total) into the muscle once. - cyclobenzaprine (FLEXERIL) 10 MG tablet; Take 1 tablet (10 mg total) by mouth 3 (three) times daily as needed for muscle spasms.  Dispense: 30 tablet; Refill: 0 - naproxen (NAPROSYN) 500 MG tablet; Take 1 tablet (500 mg total) by mouth 2 (two) times daily with a meal.  Dispense: 60 tablet; Refill: [place], FNP

## 2016-08-06 ENCOUNTER — Other Ambulatory Visit: Payer: Self-pay | Admitting: Family Medicine

## 2016-08-06 DIAGNOSIS — E119 Type 2 diabetes mellitus without complications: Secondary | ICD-10-CM

## 2016-08-08 ENCOUNTER — Other Ambulatory Visit: Payer: Self-pay | Admitting: Family Medicine

## 2016-08-12 ENCOUNTER — Ambulatory Visit: Payer: BLUE CROSS/BLUE SHIELD | Admitting: Family Medicine

## 2016-08-20 ENCOUNTER — Encounter: Payer: Self-pay | Admitting: Family Medicine

## 2016-08-20 ENCOUNTER — Ambulatory Visit (INDEPENDENT_AMBULATORY_CARE_PROVIDER_SITE_OTHER): Payer: BLUE CROSS/BLUE SHIELD | Admitting: Family Medicine

## 2016-08-20 VITALS — BP 122/85 | HR 68 | Temp 97.1°F | Ht 74.0 in | Wt 225.6 lb

## 2016-08-20 DIAGNOSIS — M25512 Pain in left shoulder: Secondary | ICD-10-CM

## 2016-08-20 DIAGNOSIS — K5900 Constipation, unspecified: Secondary | ICD-10-CM | POA: Diagnosis not present

## 2016-08-20 DIAGNOSIS — E119 Type 2 diabetes mellitus without complications: Secondary | ICD-10-CM | POA: Diagnosis not present

## 2016-08-20 MED ORDER — CANAGLIFLOZIN 100 MG PO TABS: 100.0000 mg | ORAL_TABLET | Freq: Every day | ORAL | 3 refills | Status: DC

## 2016-08-20 MED ORDER — GLIPIZIDE 10 MG PO TABS: 10.0000 mg | ORAL_TABLET | Freq: Every day | ORAL | 3 refills | Status: DC

## 2016-08-20 NOTE — Patient Instructions (Signed)
Great to see you!  Change glucotrol ( glipizide) to 1 pill only in the morning.   Try miralax 1 capful daily for constipation, you can increase to 2 caps daily if needed  Come back in 3 months  [place] job with your blood sugars, keep up the good work!

## 2016-08-20 NOTE — Progress Notes (Signed)
   HPI  Patient presents today for follow-up of diabetes and left shoulder pain.  Left shoulder pain Improved, completely resolved for a time and now worsening somewhat. Not as severe as previous.  Diabetes Tolerating medications well, has had blood sugars as low as 80s with some symptoms of hypoglycemia. Patient states this mostly happens when he takes glipizide at night. He ends up eating extra sugar sweetened foods to recover. Average fasting blood sugar is 120-140. Patient has noticed a very good improvement on new medication.  Constipation Patient is having a BM once every 2 days, he states that he feels uncomfortable and feels like he needs a BM every day and wonders if it could be the new medication. He has tried Colace without improvement  PMH: Smoking status noted ROS: Per HPI  Objective: BP 122/85   Pulse 68   Temp (!) 97.1 F (36.2 C) (Oral)   Ht '6\' 2"'$  (1.88 m)   Wt 225 lb 9.6 oz (102.3 kg)   BMI 28.97 kg/m  Gen: NAD, alert, cooperative with exam HEENT: NCAT CV: RRR, good S1/S2, no murmur Resp: CTABL, no wheezes, non-labored Ext: No edema, warm Neuro: Alert and oriented, No gross deficits  Diabetic Foot Exam - Simple   Simple Foot Form Diabetic Foot exam was performed with the following findings:  Yes 08/20/2016  3:59 PM  Visual Inspection No deformities, no ulcerations, no other skin breakdown bilaterally:  Yes Sensation Testing See comments:  Yes Pulse Check Posterior Tibialis and Dorsalis pulse intact bilaterally:  Yes Comments Left lateral foot well sensation absent to monofilament, otherwise intact throughout Thick callus bilateral heels      Assessment and plan:  # Type 2 diabetes Control very much improved, 7.1 from a previous A1c of 9.6. Decrease Glucotrol to 10 mg only once daily in the morning. invokana has made a big improvement I explained it is better to have good consistent glycemic control rather than having to recover, also discussed  sugar hypoglycemia, patient is symptomatic at 89   # Left shoulder pain X-ray showing small area of calcification of likely previous shoulder Tear Pain is mild-to-moderate today Offered orthopedics referral which she declines at this time.  # Constipation Recommended Mira lax, can be symptom of invokana    Orders Placed This Encounter  Procedures  . Microalbumin / creatinine urine ratio  . Bayer DCA Hb A1c Waived  . BMP8+EGFR    Meds ordered this encounter  Medications  . glipiZIDE (GLUCOTROL) 10 MG tablet    Sig: Take 1 tablet (10 mg total) by mouth daily before breakfast.    Dispense:  90 tablet    Refill:  3  . canagliflozin (INVOKANA) 100 MG TABS tablet    Sig: Take 1 tablet (100 mg total) by mouth daily before breakfast.    Dispense:  90 tablet    Refill:  [place], MD [facility] 08/20/2016, 5:11 PM

## 2016-08-21 ENCOUNTER — Encounter: Payer: Self-pay | Admitting: Family Medicine

## 2016-08-21 LAB — BMP8+EGFR
BUN / CREAT RATIO: 18 (ref 9–20)
BUN: 13 mg/dL (ref 6–24)
CHLORIDE: 102 mmol/L (ref 96–106)
CO2: 22 mmol/L (ref 20–29)
Calcium: 9.2 mg/dL (ref 8.7–10.2)
Creatinine, Ser: 0.74 mg/dL — ABNORMAL LOW (ref 0.76–1.27)
GFR calc non Af Amer: 106 mL/min/{1.73_m2} (ref >59)
GFR, EST AFRICAN AMERICAN: 123 mL/min/{1.73_m2} (ref >59)
Glucose: 204 mg/dL — ABNORMAL HIGH (ref 65–99)
Potassium: 4.3 mmol/L (ref 3.5–5.2)
Sodium: 139 mmol/L (ref 134–144)

## 2016-08-21 LAB — MICROALBUMIN / CREATININE URINE RATIO
Creatinine, Urine: 54.3 mg/dL
Microalbumin, Urine: <3 ug/mL

## 2016-08-22 LAB — BAYER DCA HB A1C WAIVED: HB A1C (BAYER DCA - WAIVED): 7.1 % — ABNORMAL HIGH (ref <7.0)

## 2016-10-10 ENCOUNTER — Other Ambulatory Visit: Payer: Self-pay | Admitting: Family Medicine

## 2016-10-18 ENCOUNTER — Other Ambulatory Visit: Payer: Self-pay | Admitting: Family Medicine

## 2016-10-28 ENCOUNTER — Other Ambulatory Visit: Payer: Self-pay | Admitting: Family

## 2016-10-28 DIAGNOSIS — M7582 Other shoulder lesions, left shoulder: Secondary | ICD-10-CM

## 2016-11-20 ENCOUNTER — Encounter: Payer: Self-pay | Admitting: Family Medicine

## 2016-11-20 ENCOUNTER — Ambulatory Visit (INDEPENDENT_AMBULATORY_CARE_PROVIDER_SITE_OTHER): Payer: BLUE CROSS/BLUE SHIELD | Admitting: Family Medicine

## 2016-11-20 VITALS — BP 128/78 | HR 79 | Temp 97.5°F | Ht 74.0 in | Wt 227.2 lb

## 2016-11-20 DIAGNOSIS — Z23 Encounter for immunization: Secondary | ICD-10-CM

## 2016-11-20 DIAGNOSIS — E119 Type 2 diabetes mellitus without complications: Secondary | ICD-10-CM

## 2016-11-20 DIAGNOSIS — K625 Hemorrhage of anus and rectum: Secondary | ICD-10-CM

## 2016-11-20 LAB — BAYER DCA HB A1C WAIVED: HB A1C: 7.1 % — AB (ref <7.0)

## 2016-11-20 MED ORDER — SAXAGLIPTIN-METFORMIN ER 5-1000 MG PO TB24: 1.0000 | ORAL_TABLET | Freq: Every day | ORAL | 11 refills | Status: DC

## 2016-11-20 MED ORDER — SAXAGLIPTIN-METFORMIN ER 2.5-1000 MG PO TB24: 1.0000 | ORAL_TABLET | Freq: Two times a day (BID) | ORAL | 11 refills | Status: DC

## 2016-11-20 NOTE — Progress Notes (Signed)
   HPI  Patient presents today here for follow-up of type 2 diabetes and intermittent rectal bleeding.  Type 2 diabetes Very good medication compliance, watching diet moderately. Very active. Patient is tolerating medications well.  Rectal bleeding Small amount of blood in the stool and in the water with constipation, hacked happening approximately 2 or 3 times a week.  Patient reports normal colonoscopy about 4 years ago in [place]. No rectal pain. He has had constipation as well, stool softeners and Metamucil seem to make his rectal bleeding worse.  PMH: Smoking status noted ROS: Per HPI  Objective: BP 128/78   Pulse 79   Temp (!) 97.5 F (36.4 C) (Oral)   Ht '6\' 2"'$  (1.88 m)   Wt 227 lb 3.2 oz (103.1 kg)   BMI 29.17 kg/m  Gen: NAD, alert, cooperative with exam HEENT: NCAT, EOMI, PERRL CV: RRR, good S1/S2, no murmur Resp: CTABL, no wheezes, non-labored Ext: No edema, warm Neuro: Alert and oriented, No gross deficits  Assessment and plan:  #Rectal bleeding Patient reports normal colonoscopy a few years ago, we do not have record of this. I am suspicious of internal hemorrhoids. Rectal exam with no clear findings today. Recommended GI referral which was placed today  #Type 2 diabetes A1c well controlled, refill current medications No changes    Orders Placed This Encounter  Procedures  . Flu Vaccine QUAD 36+ mos IM  . Bayer DCA Hb A1c Waived  . CBC with Differential/Platelet  . CMP14+EGFR  . Ambulatory referral to Gastroenterology    Referral Priority:   Routine    Referral Type:   Consultation    Referral Reason:   Specialty Services Required    Number of Visits Requested:   1    Meds ordered this encounter  Medications  . DISCONTD: Saxagliptin-Metformin 2.05-998 MG TB24    Sig: Take 1 tablet by mouth 2 (two) times daily.    Dispense:  60 tablet    Refill:  11  . Saxagliptin-Metformin 05-998 MG TB24    Sig: Take 1 tablet by mouth daily.   Dispense:  30 tablet    Refill:  11    D/C saxagliptin-metformin ER 2.05-998    Laroy Apple, MD [place] 11/20/2016, 5:20 PM

## 2016-11-20 NOTE — Patient Instructions (Signed)
Great to see you!  Come back in 3 months unless you need us sooner.    

## 2016-11-21 LAB — CBC WITH DIFFERENTIAL/PLATELET
BASOS: 0 %
Basophils Absolute: 0 10*3/uL (ref 0.0–0.2)
EOS (ABSOLUTE): 0.4 10*3/uL (ref 0.0–0.4)
Eos: 5 %
HEMATOCRIT: 35.3 % — AB (ref 37.5–51.0)
HEMOGLOBIN: 11.1 g/dL — AB (ref 13.0–17.7)
IMMATURE GRANULOCYTES: 0 %
Immature Grans (Abs): 0 10*3/uL (ref 0.0–0.1)
LYMPHS ABS: 3.6 10*3/uL — AB (ref 0.7–3.1)
Lymphs: 41 %
MCH: 23 pg — ABNORMAL LOW (ref 26.6–33.0)
MCHC: 31.4 g/dL — ABNORMAL LOW (ref 31.5–35.7)
MCV: 73 fL — AB (ref 79–97)
MONOCYTES: 8 %
MONOS ABS: 0.7 10*3/uL (ref 0.1–0.9)
NEUTROS PCT: 46 %
Neutrophils Absolute: 4 10*3/uL (ref 1.4–7.0)
Platelets: 249 10*3/uL (ref 150–379)
RBC: 4.83 x10E6/uL (ref 4.14–5.80)
RDW: 16.9 % — AB (ref 12.3–15.4)
WBC: 8.7 10*3/uL (ref 3.4–10.8)

## 2016-11-21 LAB — CMP14+EGFR
A/G RATIO: 1.1 — AB (ref 1.2–2.2)
ALBUMIN: 4.3 g/dL (ref 3.5–5.5)
ALT: 35 IU/L (ref 0–44)
AST: 41 IU/L — AB (ref 0–40)
Alkaline Phosphatase: 54 IU/L (ref 39–117)
BUN / CREAT RATIO: 17 (ref 9–20)
BUN: 14 mg/dL (ref 6–24)
Bilirubin Total: 0.3 mg/dL (ref 0.0–1.2)
CALCIUM: 9.4 mg/dL (ref 8.7–10.2)
CO2: 23 mmol/L (ref 20–29)
CREATININE: 0.83 mg/dL (ref 0.76–1.27)
Chloride: 103 mmol/L (ref 96–106)
GFR, EST AFRICAN AMERICAN: 117 mL/min/{1.73_m2} (ref >59)
GFR, EST NON AFRICAN AMERICAN: 101 mL/min/{1.73_m2} (ref >59)
GLOBULIN, TOTAL: 4 g/dL (ref 1.5–4.5)
Glucose: 111 mg/dL — ABNORMAL HIGH (ref 65–99)
POTASSIUM: 4.1 mmol/L (ref 3.5–5.2)
SODIUM: 140 mmol/L (ref 134–144)
TOTAL PROTEIN: 8.3 g/dL (ref 6.0–8.5)

## 2016-11-23 LAB — IRON AND TIBC
Iron Saturation: 9 % — CL (ref 15–55)
Iron: 45 ug/dL (ref 38–169)
TIBC: 496 ug/dL — AB (ref 250–450)
UIBC: 451 ug/dL — ABNORMAL HIGH (ref 111–343)

## 2016-11-23 LAB — SPECIMEN STATUS REPORT

## 2016-11-24 ENCOUNTER — Other Ambulatory Visit: Payer: Self-pay | Admitting: Family Medicine

## 2016-11-24 MED ORDER — FERROUS SULFATE 325 (65 FE) MG PO TABS: 325.0000 mg | ORAL_TABLET | Freq: Every day | ORAL | 2 refills | Status: DC

## 2016-11-28 ENCOUNTER — Encounter (INDEPENDENT_AMBULATORY_CARE_PROVIDER_SITE_OTHER): Payer: Self-pay | Admitting: Internal Medicine

## 2016-12-11 ENCOUNTER — Ambulatory Visit (INDEPENDENT_AMBULATORY_CARE_PROVIDER_SITE_OTHER): Payer: BLUE CROSS/BLUE SHIELD | Admitting: Internal Medicine

## 2016-12-11 ENCOUNTER — Encounter (INDEPENDENT_AMBULATORY_CARE_PROVIDER_SITE_OTHER): Payer: Self-pay | Admitting: Internal Medicine

## 2016-12-11 ENCOUNTER — Encounter (INDEPENDENT_AMBULATORY_CARE_PROVIDER_SITE_OTHER): Payer: Self-pay | Admitting: *Deleted

## 2016-12-11 ENCOUNTER — Telehealth (INDEPENDENT_AMBULATORY_CARE_PROVIDER_SITE_OTHER): Payer: Self-pay | Admitting: *Deleted

## 2016-12-11 VITALS — BP 130/68 | HR 64 | Temp 98.0°F | Ht 74.0 in | Wt 228.2 lb

## 2016-12-11 DIAGNOSIS — K625 Hemorrhage of anus and rectum: Secondary | ICD-10-CM | POA: Diagnosis not present

## 2016-12-11 MED ORDER — PEG 3350-KCL-NA BICARB-NACL 420 G PO SOLR: 4000.0000 mL | Freq: Once | ORAL | 0 refills | Status: AC

## 2016-12-11 NOTE — Telephone Encounter (Signed)
Patient needs trilyte 

## 2016-12-11 NOTE — Progress Notes (Signed)
Subjective:    Patient ID: Kenneth Stone, male    DOB: 1965-10-05, 51 y.o.   MRN: [id number]  HPI PCP Dr. Wendi Snipes.  Referred by Dr. Wendi Snipes for rectal bleeding.  Sees blood x 2 tiems a week with his BMs. He says he strains and see blood. Symptoms for a couple of months.  He takes Miralax daily for his constipation, which really has not helped. . Has tried a stool softener which really does not help.  He is having a BM daily and sometimes every other day.  Stools are black now from iron he started one week ago.  His appetite is not good. Minimal weight loss. States he has acid reflux. Takes Protonix BID which helps some. No dysphagia. No problems with eating meats.  States he eats a lot of fruits.  States he had a colonoscopy 4 yrs ago but I do not see in system. States it was done in [place].    11/20/2016 TIBC 496, UIBC 451, Iron 45, Iron saturation 9  CBC    Component Value Date/Time   WBC 8.7 11/20/2016 1616   RBC 4.83 11/20/2016 1616   HGB 11.1 (L) 11/20/2016 1616   HCT 35.3 (L) 11/20/2016 1616   PLT 249 11/20/2016 1616   MCV 73 (L) 11/20/2016 1616   MCH 23.0 (L) 11/20/2016 1616   MCHC 31.4 (L) 11/20/2016 1616   RDW 16.9 (H) 11/20/2016 1616   LYMPHSABS 3.6 (H) 11/20/2016 1616   EOSABS 0.4 11/20/2016 1616   BASOSABS 0.0 11/20/2016 1616    No family hx of colon cancer.  Diabetic x 7 yrs.   Review of Systems Past Medical History:  Diagnosis Date  . Diabetes mellitus without complication ([place])   . Essential hypertension 09/05/2014  . GERD (gastroesophageal reflux disease)   . Hyperlipidemia 12/31/2014  . 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 No pertinent surgical history.  No Known Allergies  Current Outpatient Medications on File Prior to Visit  Medication Sig Dispense Refill  . atorvastatin (LIPITOR) 20 MG tablet TAKE 1 TABLET (20 MG TOTAL) BY MOUTH DAILY. 90 tablet 0  . canagliflozin (INVOKANA) 100 MG TABS tablet Take 1  tablet (100 mg total) by mouth daily before breakfast. 90 tablet 3  . ferrous sulfate 325 (65 FE) MG tablet Take 1 tablet (325 mg total) by mouth daily with breakfast. 60 tablet 2  . glipiZIDE (GLUCOTROL) 10 MG tablet Take 1 tablet (10 mg total) by mouth daily before breakfast. 90 tablet 3  . naproxen (NAPROSYN) 500 MG tablet TAKE 1 TABLET (500 MG TOTAL) BY MOUTH 2 (TWO) TIMES DAILY WITH A MEAL. 60 tablet 0  . pantoprazole (PROTONIX) 40 MG tablet TAKE 1 TABLET (40 MG TOTAL) BY MOUTH DAILY. 90 tablet 1  . quinapril (ACCUPRIL) 40 MG tablet TAKE 1 TABLET (40 MG TOTAL) BY MOUTH AT BEDTIME. 90 tablet 0  . Saxagliptin-Metformin 05-998 MG TB24 Take 1 tablet by mouth daily. 30 tablet 11  . glucose blood test strip Use as instructed (Patient not taking: Reported on 12/11/2016) 100 each 12   No current facility-administered medications on file prior to visit.         Objective:   Physical Exam. Blood pressure 130/68, pulse 64, temperature 98 F (36.7 C), height 6\' 2"  (1.88 m), weight 228 lb 3.2 oz (103.5 kg). Alert and oriented. Skin warm and dry. Oral mucosa is moist.   . Sclera anicteric, conjunctivae is pink. Thyroid not enlarged. No cervical lymphadenopathy.  Lungs clear. Heart regular rate and rhythm.  Abdomen is soft. Bowel sounds are positive. No hepatomegaly. No abdominal masses felt. No tenderness.  No edema to lower extremities. . Stool brown and guaiac negative.          Assessment & Plan:  Constipation: Amitiza  24mg  BID. Rectal bleeding: Need s colonoscopy to rule out colonic neoplasm.  The risks of bleeding, perforation and infection were reviewed with patient.

## 2016-12-11 NOTE — Patient Instructions (Signed)
The risks of bleeding, perforation and infection were reviewed with patient.  

## 2016-12-16 ENCOUNTER — Encounter: Payer: Self-pay | Admitting: *Deleted

## 2016-12-19 ENCOUNTER — Encounter: Payer: Self-pay | Admitting: Pediatrics

## 2016-12-19 ENCOUNTER — Ambulatory Visit: Payer: BLUE CROSS/BLUE SHIELD | Admitting: Pediatrics

## 2016-12-19 VITALS — BP 128/78 | HR 76 | Temp 97.5°F | Ht 74.0 in | Wt 226.2 lb

## 2016-12-19 DIAGNOSIS — K5900 Constipation, unspecified: Secondary | ICD-10-CM | POA: Diagnosis not present

## 2016-12-19 DIAGNOSIS — J019 Acute sinusitis, unspecified: Secondary | ICD-10-CM

## 2016-12-19 MED ORDER — AMOXICILLIN-POT CLAVULANATE 875-125 MG PO TABS: 1.0000 | ORAL_TABLET | Freq: Two times a day (BID) | ORAL | 0 refills | Status: DC

## 2016-12-19 NOTE — Progress Notes (Signed)
  Subjective:   Patient ID: Kenneth Stone, male    DOB: Oct 12, 1965, 51 y.o.   MRN: [id number] CC: Facial Pain and Nasal Congestion  HPI: Kenneth Stone is a 51 y.o. male presenting for Facial Pain and Nasal Congestion  Ongoing past 2 weeks Feeling worse past couple of days Achy at times Lots of nasal congestion and facial pressure Taking tylenol sinus, helped minimally Appetite slightly down Has been constipated recently Started on iron for low iron levels Has colonoscopy upcoming next week  Relevant past medical, surgical, family and social history reviewed. Allergies and medications reviewed and updated. Social History   Tobacco Use  Smoking Status Never Smoker  Smokeless Tobacco Never Used   ROS: Per HPI   Objective:    BP 128/78   Pulse 76   Temp (!) 97.5 F (36.4 C) (Oral)   Ht 6\' 2"  (1.88 m)   Wt 226 lb 3.2 oz (102.6 kg)   BMI 29.04 kg/m   Wt Readings from Last 3 Encounters:  12/19/16 226 lb 3.2 oz (102.6 kg)  12/11/16 228 lb 3.2 oz (103.5 kg)  11/20/16 227 lb 3.2 oz (103.1 kg)    Gen: NAD, alert, cooperative with exam, NCAT EYES: EOMI, no conjunctival injection, or no icterus ENT:  TMs pearly gray b/l, OP with mild erythema, mild tenderness over max sinuses b/l LYMPH: no cervical LAD CV: NRRR, normal S1/S2, no murmur, distal pulses 2+ b/l Resp: CTABL, no wheezes, normal WOB Abd: +BS, soft, NTND. no guarding or organomegaly Ext: No edema, warm, varicose veins soft b/l LE Neuro: Alert and oriented, strength equal b/l UE and LE, coordination grossly normal MSK: normal muscle bulk  Assessment & Plan:  Kenneth Stone was seen today for facial pain and nasal congestion.  Diagnoses and all orders for this visit:  Acute sinusitis, recurrence not specified, unspecified location Sinus rinses twice a day Start below -     amoxicillin-clavulanate (AUGMENTIN) 875-125 MG tablet; Take 1 tablet by mouth 2 (two) times daily.  Constipation, unspecified constipation type Trial  of linzess, take iron qod  Follow up plan: As needed, f/u as sched with pcp Assunta Found, MD [place]

## 2016-12-29 ENCOUNTER — Other Ambulatory Visit: Payer: Self-pay | Admitting: Family Medicine

## 2017-01-23 ENCOUNTER — Encounter (HOSPITAL_COMMUNITY): Payer: Self-pay | Admitting: *Deleted

## 2017-01-23 ENCOUNTER — Other Ambulatory Visit: Payer: Self-pay

## 2017-01-23 ENCOUNTER — Encounter (HOSPITAL_COMMUNITY)
Admission: RE | Disposition: A | Discharge status: Home / Self Care | Payer: Self-pay | Source: Ambulatory Visit | Attending: Internal Medicine

## 2017-01-23 ENCOUNTER — Ambulatory Visit (HOSPITAL_COMMUNITY)
Admission: RE | Admit: 2017-01-23 | Discharge: 2017-01-23 | Disposition: A | Discharge status: Home / Self Care | Payer: BLUE CROSS/BLUE SHIELD | Source: Ambulatory Visit | Attending: Internal Medicine | Admitting: Internal Medicine

## 2017-01-23 DIAGNOSIS — K625 Hemorrhage of anus and rectum: Secondary | ICD-10-CM | POA: Insufficient documentation

## 2017-01-23 DIAGNOSIS — K219 Gastro-esophageal reflux disease without esophagitis: Secondary | ICD-10-CM | POA: Diagnosis not present

## 2017-01-23 DIAGNOSIS — D649 Anemia, unspecified: Secondary | ICD-10-CM | POA: Diagnosis not present

## 2017-01-23 DIAGNOSIS — E119 Type 2 diabetes mellitus without complications: Secondary | ICD-10-CM | POA: Diagnosis not present

## 2017-01-23 DIAGNOSIS — K6289 Other specified diseases of anus and rectum: Secondary | ICD-10-CM | POA: Diagnosis not present

## 2017-01-23 DIAGNOSIS — Z1211 Encounter for screening for malignant neoplasm of colon: Secondary | ICD-10-CM | POA: Insufficient documentation

## 2017-01-23 DIAGNOSIS — Z7984 Long term (current) use of oral hypoglycemic drugs: Secondary | ICD-10-CM | POA: Diagnosis not present

## 2017-01-23 DIAGNOSIS — K644 Residual hemorrhoidal skin tags: Secondary | ICD-10-CM | POA: Diagnosis not present

## 2017-01-23 DIAGNOSIS — K5909 Other constipation: Secondary | ICD-10-CM | POA: Insufficient documentation

## 2017-01-23 DIAGNOSIS — I10 Essential (primary) hypertension: Secondary | ICD-10-CM | POA: Diagnosis not present

## 2017-01-23 DIAGNOSIS — E785 Hyperlipidemia, unspecified: Secondary | ICD-10-CM | POA: Diagnosis not present

## 2017-01-23 DIAGNOSIS — Z79899 Other long term (current) drug therapy: Secondary | ICD-10-CM | POA: Insufficient documentation

## 2017-01-23 DIAGNOSIS — K648 Other hemorrhoids: Secondary | ICD-10-CM | POA: Diagnosis not present

## 2017-01-23 HISTORY — PX: COLONOSCOPY: SHX5424

## 2017-01-23 LAB — GLUCOSE, CAPILLARY: Glucose-Capillary: 129 mg/dL — ABNORMAL HIGH (ref 65–99)

## 2017-01-23 SURGERY — COLONOSCOPY
Anesthesia: Moderate Sedation

## 2017-01-23 MED ORDER — BISACODYL 10 MG RE SUPP: 10.0000 mg | RECTAL | 0 refills | Status: DC | PRN

## 2017-01-23 MED ORDER — MIDAZOLAM HCL 5 MG/5ML IJ SOLN
INTRAMUSCULAR | Status: DC | PRN
  Administered 2017-01-23: 1 mg via INTRAVENOUS
  Administered 2017-01-23 (×4): 2 mg via INTRAVENOUS

## 2017-01-23 MED ORDER — BENEFIBER DRINK MIX PO PACK: 4.0000 g | PACK | Freq: Every day | ORAL | Status: DC

## 2017-01-23 MED ORDER — SODIUM CHLORIDE 0.9 % IV SOLN
INTRAVENOUS | Status: DC
  Administered 2017-01-23: 1000 mL via INTRAVENOUS

## 2017-01-23 MED ORDER — MEPERIDINE HCL 50 MG/ML IJ SOLN
INTRAMUSCULAR | Status: DC | PRN
  Administered 2017-01-23 (×2): 25 mg via INTRAVENOUS

## 2017-01-23 MED ORDER — MIDAZOLAM HCL 5 MG/5ML IJ SOLN
INTRAMUSCULAR | Status: AC
  Filled 2017-01-23: qty 10

## 2017-01-23 MED ORDER — MEPERIDINE HCL 50 MG/ML IJ SOLN
INTRAMUSCULAR | Status: AC
  Filled 2017-01-23: qty 1

## 2017-01-23 NOTE — Op Note (Signed)
[facility] Patient Name: Kenneth Stone Procedure Date: 01/23/2017 1:28 PM MRN: [id number] Date of Birth: [birth year] Attending MD: Hildred Laser , MD CSN: [id number] Age: 51 Admit Type: Ambulatory Procedure:                Colonoscopy Indications:              Screening for colorectal malignant neoplasm Providers:                Hildred Laser, MD, Lurline Del, RN, Rosina Lowenstein, RN Referring MD:             Sherley Bounds. Wendi Snipes, MD Medicines:                Meperidine 50 mg IV, Midazolam 9 mg IV Complications:            No immediate complications. Estimated Blood Loss:     Estimated blood loss: none. Procedure:                Pre-Anesthesia Assessment:                           - Prior to the procedure, a History and Physical                            was performed, and patient medications and                            allergies were reviewed. The patient's tolerance of                            previous anesthesia was also reviewed. The risks                            and benefits of the procedure and the sedation                            options and risks were discussed with the patient.                            All questions were answered, and informed consent                            was obtained. Prior Anticoagulants: The patient has                            taken no previous anticoagulant or antiplatelet                            agents. ASA Grade Assessment: II - A patient with                            mild systemic disease. After reviewing the risks                            and benefits, the patient was deemed in  satisfactory condition to undergo the procedure.                           After obtaining informed consent, the colonoscope                            was passed under direct vision. Throughout the                            procedure, the patient's blood pressure, pulse, and                            oxygen saturations  were monitored continuously. The                            [id number] ([id number]) scope was introduced through                            the anus and advanced to the the cecum, identified                            by appendiceal orifice and ileocecal valve. The                            colonoscopy was performed without difficulty. The                            patient tolerated the procedure well. The quality                            of the bowel preparation was good. The ileocecal                            valve, appendiceal orifice, and rectum were                            photographed. Scope In: 1:54:29 PM Scope Out: 2:16:02 PM Scope Withdrawal Time: 0 hours 13 minutes 9 seconds  Total Procedure Duration: 0 hours 21 minutes 33 seconds  Findings:      The perianal and digital rectal examinations were normal.      The colon (entire examined portion) appeared normal.      External hemorrhoids were found during retroflexion. The hemorrhoids       were small.      A 10 mm polypoid lesion was found at the anus. No bleeding was present. Impression:               - The entire examined colon is normal.                           - External hemorrhoids.                           - 10 mm polypoidal lesion originating from anal  canal. Is is possibly hypertrophic papilla.                           - No specimens collected. Moderate Sedation:      Moderate (conscious) sedation was administered by the endoscopy nurse       and supervised by the endoscopist. The following parameters were       monitored: oxygen saturation, heart rate, blood pressure, CO2       capnography and response to care. Total physician intraservice time was       31 minutes. Recommendation:           - Patient has a contact number available for                            emergencies. The signs and symptoms of potential                            delayed complications were discussed with  the                            patient. Return to normal activities tomorrow.                            Written discharge instructions were provided to the                            patient.                           - High fiber diet today.                           - Continue present medications.                           - Benefiber 4 gm po qhs.                           - dulcolax supp prn.                           - Exam under anesthesia with excision if                            appropriate.                           - Repeat colonoscopy in 10 years for screening                            purposes. Procedure Code(s):        --- Professional ---                           [phone], Colonoscopy, flexible; diagnostic, including  collection of specimen(s) by brushing or washing,                            when performed (separate procedure)                           99152, Moderate sedation services provided by the                            same physician or other qualified health care                            professional performing the diagnostic or                            therapeutic service that the sedation supports,                            requiring the presence of an independent trained                            observer to assist in the monitoring of the                            patient's level of consciousness and physiological                            status; initial 15 minutes of intraservice time,                            patient age 60 years or older                           [phone], Moderate sedation services; each additional                            15 minutes intraservice time Diagnosis Code(s):        --- Professional ---                           Z12.11, Encounter for screening for malignant                            neoplasm of colon                           K64.4, Residual hemorrhoidal skin tags CPT copyright 2016  American Medical Association. All rights reserved. The codes documented in this report are preliminary and upon coder review may  be revised to meet current compliance requirements. Hildred Laser, MD Hildred Laser, MD 01/23/2017 2:36:23 PM This report has been signed electronically. Number of Addenda: 0

## 2017-01-23 NOTE — H&P (Signed)
Kenneth Stone is an 51 y.o. male.   Chief Complaint: Patient is here for colonoscopy. HPI: Patient is 51 year old here for screening colonoscopy.  He has never been screened for CRC before.  His records indicate that he had colonoscopy 4 years ago but that is not the case.  He has chronic constipation.  He has tried various laxatives with limited success.  He has noted blood with his bowel movements but he is constipated off and on for years.  He has not experienced melena or frank bleeding. Vision states Dr. Wendi Snipes wanted him to undergo colonoscopy for screening purposes.  He he has felt all along that his hematochezia is due to hemorrhoids because of chronic constipation. On blood work he was found to have anemia with low serum iron and saturation.  Recent Hemoccult was negative. Family history is negative for CRC.  Past Medical History:  Diagnosis Date  . Diabetes mellitus without complication ([place])   . Essential hypertension 09/05/2014  . GERD (gastroesophageal reflux disease)   . Hyperlipidemia 12/31/2014  . 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 No pertinent surgical history.  Family History  Problem Relation Age of Onset  . Cancer Mother   . Heart disease Father    Social History:  reports that  has never smoked. he has never used smokeless tobacco. He reports that he drinks about 1.2 oz of alcohol per week. He reports that he does not use drugs.  Allergies: No Known Allergies  Medications Prior to Admission  Medication Sig Dispense Refill  . atorvastatin (LIPITOR) 20 MG tablet TAKE 1 TABLET (20 MG TOTAL) BY MOUTH DAILY. (Patient taking differently: Take 20 mg by mouth daily as needed. When eating a large meal) 90 tablet 0  . canagliflozin (INVOKANA) 100 MG TABS tablet Take 1 tablet (100 mg total) by mouth daily before breakfast. 90 tablet 3  . FIBER PO Take 4 capsules by mouth daily.    Marland Kitchen KOMBIGLYZE XR 2.05-998 MG TB24 Take 1 tablet by mouth 2 (two)  times daily.  5  . pantoprazole (PROTONIX) 40 MG tablet TAKE 1 TABLET (40 MG TOTAL) BY MOUTH DAILY. 90 tablet 1  . quinapril (ACCUPRIL) 40 MG tablet TAKE 1 TABLET (40 MG TOTAL) BY MOUTH AT BEDTIME. 90 tablet 0  . vitamin B-12 (CYANOCOBALAMIN) 1000 MCG tablet Take 1,000 mcg by mouth daily.    Marland Kitchen amoxicillin-clavulanate (AUGMENTIN) 875-125 MG tablet Take 1 tablet by mouth 2 (two) times daily. (Patient not taking: Reported on 01/16/2017) 20 tablet 0  . ferrous sulfate 325 (65 FE) MG tablet Take 1 tablet (325 mg total) by mouth daily with breakfast. 60 tablet 2  . glipiZIDE (GLUCOTROL) 10 MG tablet Take 1 tablet (10 mg total) by mouth daily before breakfast. (Patient not taking: Reported on 01/16/2017) 90 tablet 3  . glucose blood test strip Use as instructed 100 each 12  . naproxen (NAPROSYN) 500 MG tablet TAKE 1 TABLET (500 MG TOTAL) BY MOUTH 2 (TWO) TIMES DAILY WITH A MEAL. (Patient not taking: Reported on 01/16/2017) 60 tablet 0  . Saxagliptin-Metformin 05-998 MG TB24 Take 1 tablet by mouth daily. (Patient not taking: Reported on 01/16/2017) 30 tablet 11    Results for orders placed or performed during the hospital encounter of 01/23/17 (from the past 48 hour(s))  Glucose, capillary     Status: Abnormal   Collection Time: 01/23/17  1:15 PM  Result Value Ref Range   Glucose-Capillary 129 (H) 65 - 99 mg/dL  No results found.  ROS  Blood pressure (!) 151/87, pulse 60, temperature (!) 97.5 F (36.4 C), temperature source Oral, resp. rate 17, height 6\' 2"  (1.88 m), weight 228 lb (103.4 kg), SpO2 100 %. Physical Exam  Constitutional: He appears well-developed and well-nourished.  HENT:  Mouth/Throat: Oropharynx is clear and moist.  Eyes: Conjunctivae are normal.  Neck: No thyromegaly present.  Cardiovascular: Normal rate, regular rhythm and normal heart sounds.  No murmur heard. Respiratory: Effort normal and breath sounds normal.  GI: Soft. He exhibits no distension and no mass. There is  no tenderness.  Musculoskeletal: He exhibits no edema.  Lymphadenopathy:    He has no cervical adenopathy.  Neurological: He is alert.  Skin: Skin is warm and dry.     Assessment/Plan Colonoscopy for screening purposes. Intermittent hematochezia in setting of chronic constipation felt to be due to hemorrhoids.  Hildred Laser, MD 01/23/2017, 1:38 PM

## 2017-01-23 NOTE — Discharge Instructions (Signed)
Resume usual medications as before. Benefiber 4 g p.o. Nightly. Can use Dulcolax suppository on as-needed basis. High fiber diet. No driving for 24 hours.   Colonoscopy, Adult, Care After This sheet gives you information about how to care for yourself after your procedure. Your doctor may also give you more specific instructions. If you have problems or questions, call your doctor. Follow these instructions at home: General instructions   For the first 24 hours after the procedure: ? Do not drive or use machinery. ? Do not sign important documents. ? Do not drink alcohol. ? Do your daily activities more slowly than normal. ? Eat foods that are soft and easy to digest. ? Rest often.  Take over-the-counter or prescription medicines only as told by your doctor.  It is up to you to get the results of your procedure. Ask your doctor, or the department performing the procedure, when your results will be ready. To help cramping and bloating:  Try walking around.  Put heat on your belly (abdomen) as told by your doctor. Use a heat source that your doctor recommends, such as a moist heat pack or a heating pad. ? Put a towel between your skin and the heat source. ? Leave the heat on for 20-30 minutes. ? Remove the heat if your skin turns bright red. This is especially important if you cannot feel pain, heat, or cold. You can get burned. Eating and drinking  Drink enough fluid to keep your pee (urine) clear or pale yellow.  Return to your normal diet as told by your doctor. Avoid heavy or fried foods that are hard to digest.  Avoid drinking alcohol for as long as told by your doctor. Contact a doctor if:  You have blood in your poop (stool) 2-3 days after the procedure. Get help right away if:  You have more than a small amount of blood in your poop.  You see large clumps of tissue (blood clots) in your poop.  Your belly is swollen.  You feel sick to your stomach  (nauseous).  You throw up (vomit).  You have a fever.  You have belly pain that gets worse, and medicine does not help your pain. This information is not intended to replace advice given to you by your health care provider. Make sure you discuss any questions you have with your health care provider.

## 2017-01-28 ENCOUNTER — Encounter (HOSPITAL_COMMUNITY): Payer: Self-pay | Admitting: Internal Medicine

## 2017-02-19 ENCOUNTER — Encounter: Payer: Self-pay | Admitting: General Surgery

## 2017-02-19 ENCOUNTER — Ambulatory Visit: Payer: BLUE CROSS/BLUE SHIELD | Admitting: General Surgery

## 2017-02-19 VITALS — BP 188/92 | HR 87 | Temp 97.5°F | Resp 18 | Ht 74.0 in | Wt 226.0 lb

## 2017-02-19 DIAGNOSIS — K6289 Other specified diseases of anus and rectum: Secondary | ICD-10-CM

## 2017-02-19 DIAGNOSIS — K629 Disease of anus and rectum, unspecified: Secondary | ICD-10-CM

## 2017-02-19 DIAGNOSIS — K648 Other hemorrhoids: Secondary | ICD-10-CM

## 2017-02-19 NOTE — Progress Notes (Signed)
[facility] History and Physical  Reason for Referral: Lesion in anus/ rectum on colonoscopy  Referring Physician:  Dr. Laural Golden   Chief Complaint    Mass      Kenneth Stone is a 52 y.o. male.  HPI: Kenneth Stone is a very pleasant 52 yo who underwent a colonoscopy due to bleeding from his anus.  He reported that he had some bleeding, and was due for a colonoscopy, so he underwent one with Dr. Laural Golden and was found to have internal hemorrhoids with a small polypoid lesion that could be a hypertropic papilla during the retroflexion. The patient reports otherwise he is well. He still has some bleeding, but no discomfort, and says that he has no abdominal pain. He has had some issues with constipation in the past.  He was referred to my by Dr. Laural Golden for possible excision of the lesion found on retroflexion due to the unknown nature of the lesion.   He has had warts on his feet in the past, but denies any genital/ anal warts.  Past Medical History:  Diagnosis Date  . Diabetes mellitus without complication ([place])   . Essential hypertension 09/05/2014  . GERD (gastroesophageal reflux disease)   . Hyperlipidemia 12/31/2014  . 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 COLONOSCOPY N/A 01/23/2017   Procedure: COLONOSCOPY;  Surgeon: Rogene Houston, MD;  Location: [facility];  Service: Endoscopy;  Laterality: N/A;  1:00   No history of colorectal cancers Family History  Problem Relation Age of Onset  . Cancer Mother   . Heart disease Father     Social History   Tobacco Use  . Smoking status: Never Smoker  . Smokeless tobacco: Never Used  Substance Use Topics  . Alcohol use: Yes    Alcohol/week: 1.2 oz    Types: 2 Standard drinks or equivalent per week  . Drug use: No    Medications: I have reviewed the patient's current medications. Allergies as of 02/19/2017   No Known Allergies     Medication List        Accurate as of 02/19/17  4:18 PM. Always use your most recent med list.          atorvastatin 20 MG tablet Commonly known as:  LIPITOR TAKE 1 TABLET (20 MG TOTAL) BY MOUTH DAILY.   BENEFIBER DRINK MIX Pack Take 4 g by mouth at bedtime.   bisacodyl 10 MG suppository Commonly known as:  DULCOLAX Place 1 suppository (10 mg total) rectally as needed for moderate constipation.   canagliflozin 100 MG Tabs tablet Commonly known as:  INVOKANA Take 1 tablet (100 mg total) by mouth daily before breakfast.   ferrous sulfate 325 (65 FE) MG tablet Take 1 tablet (325 mg total) by mouth daily with breakfast.   FIBER PO Take 4 capsules by mouth daily.   glucose blood test strip Use as instructed   KOMBIGLYZE XR 2.05-998 MG Tb24 Generic drug:  Saxagliptin-Metformin Take 1 tablet by mouth 2 (two) times daily.   pantoprazole 40 MG tablet Commonly known as:  PROTONIX TAKE 1 TABLET (40 MG TOTAL) BY MOUTH DAILY.   quinapril 40 MG tablet Commonly known as:  ACCUPRIL TAKE 1 TABLET (40 MG TOTAL) BY MOUTH AT BEDTIME.   vitamin B-12 1000 MCG tablet Commonly known as:  CYANOCOBALAMIN Take 1,000 mcg by mouth daily.        ROS:  A comprehensive review of systems  was negative except for: Cardiovascular: positive for varicose veins Gastrointestinal: positive for blood per rectum Neurological: positive for leg numbness  Blood pressure (!) 188/92, pulse 87, temperature (!) 97.5 F (36.4 C), resp. rate 18, height 6\' 2"  (1.88 m), weight 226 lb (102.5 kg). Physical Exam  Constitutional: He is oriented to person, place, and time and well-developed, well-nourished, and in no distress.  HENT:  Head: Normocephalic.  Eyes: Pupils are equal, round, and reactive to light.  Neck: Normal range of motion.  Cardiovascular: Normal rate and regular rhythm.  Pulmonary/Chest: Effort normal and breath sounds normal.  Abdominal: Soft. He exhibits no distension. There is no tenderness.    Genitourinary:  Genitourinary Comments: No external hemorrhoids on exam, unable to palpate any lesion internally, no gross blood, normal tone  Musculoskeletal: Normal range of motion. He exhibits no edema.  Neurological: He is alert and oriented to person, place, and time.  Skin: Skin is warm and dry.  Psychiatric: Mood, memory, affect and judgment normal.  Vitals reviewed.   Results: Colonoscopy polypoid lesion and internal hemorrhoids     Assessment & Plan:  Kenneth Stone is a 51 y.o. male with known hemorrhoids and bleeding with a lesion of unknown significance in the anal canal/ rectal junction. The patient is having some bleeding regularly.   -We have discussed that the etiology of this lesion is unknown and the pathology is unknown and that it could be a cancer or a "wart" type lesion that needs surveillance. The patient also has internal hemorrhoids that are bleeding.   -We discussed the risk and benefits of an exam under anesthesia and internal hemorrhoid surgery/ essential ligation. The internal hemorrhoids are not painful to remove because the same nerves are not involved, and the sensation is different, given this I do not expect he will have significant pain from removal of the lesion or the internal hemorrhoids.   The risk of of exam under anesthesia, ligation of the internal hemorrhoid, and possible resection of the lesion, include bleeding, risk of infection although rare, and the risk of narrowing the anal canal if too much tissue is removed. Given this risk, it is likely that only the 2 largest hemorrhoid columns would be removed during the initial surgery.  We have also discussed the risk of incontinence after surgery if the muscles were injured, and although this is rare that it can happen and is another reason to limit the amount of hemorrhoids removed.  Discussed that if this is cancer, that he will require further treatments, and if it is a dysplastic lesion/ wart type  lesion that he will need to have surveillance.   All questions were answered to the satisfaction of the patient.  After careful consideration, Kenneth Stone has decided to proceed.   Virl Cagey 02/19/2017, 4:18 PM

## 2017-02-22 ENCOUNTER — Encounter: Payer: Self-pay | Admitting: General Surgery

## 2017-02-22 NOTE — H&P (Signed)
[facility] History and Physical  Reason for Referral: Lesion in anus/ rectum on colonoscopy  Referring Physician:  Dr. Laural Golden   Chief Complaint    Mass      Kenneth Stone is a 52 y.o. male.  HPI: Kenneth Stone is a very pleasant 52 yo who underwent a colonoscopy due to bleeding from his anus.  He reported that he had some bleeding, and was due for a colonoscopy, so he underwent one with Dr. Laural Golden and was found to have internal hemorrhoids with a small polypoid lesion that could be a hypertropic papilla during the retroflexion. The patient reports otherwise he is well. He still has some bleeding, but no discomfort, and says that he has no abdominal pain. He has had some issues with constipation in the past.  He was referred to my by Dr. Laural Golden for possible excision of the lesion found on retroflexion due to the unknown nature of the lesion.   He has had warts on his feet in the past, but denies any genital/ anal warts.  Past Medical History:  Diagnosis Date  . Diabetes mellitus without complication ([place])   . Essential hypertension 09/05/2014  . GERD (gastroesophageal reflux disease)   . Hyperlipidemia 12/31/2014  . 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 COLONOSCOPY N/A 01/23/2017   Procedure: COLONOSCOPY;  Surgeon: Rogene Houston, MD;  Location: [facility];  Service: Endoscopy;  Laterality: N/A;  1:00   No history of colorectal cancers Family History  Problem Relation Age of Onset  . Cancer Mother   . Heart disease Father     Social History   Tobacco Use  . Smoking status: Never Smoker  . Smokeless tobacco: Never Used  Substance Use Topics  . Alcohol use: Yes    Alcohol/week: 1.2 oz    Types: 2 Standard drinks or equivalent per week  . Drug use: No    Medications: I have reviewed the patient's current medications. Allergies as of 02/19/2017   No Known Allergies     Medication List        Accurate as of 02/19/17  4:18 PM. Always use your most recent med list.          atorvastatin 20 MG tablet Commonly known as:  LIPITOR TAKE 1 TABLET (20 MG TOTAL) BY MOUTH DAILY.   BENEFIBER DRINK MIX Pack Take 4 g by mouth at bedtime.   bisacodyl 10 MG suppository Commonly known as:  DULCOLAX Place 1 suppository (10 mg total) rectally as needed for moderate constipation.   canagliflozin 100 MG Tabs tablet Commonly known as:  INVOKANA Take 1 tablet (100 mg total) by mouth daily before breakfast.   ferrous sulfate 325 (65 FE) MG tablet Take 1 tablet (325 mg total) by mouth daily with breakfast.   FIBER PO Take 4 capsules by mouth daily.   glucose blood test strip Use as instructed   KOMBIGLYZE XR 2.05-998 MG Tb24 Generic drug:  Saxagliptin-Metformin Take 1 tablet by mouth 2 (two) times daily.   pantoprazole 40 MG tablet Commonly known as:  PROTONIX TAKE 1 TABLET (40 MG TOTAL) BY MOUTH DAILY.   quinapril 40 MG tablet Commonly known as:  ACCUPRIL TAKE 1 TABLET (40 MG TOTAL) BY MOUTH AT BEDTIME.   vitamin B-12 1000 MCG tablet Commonly known as:  CYANOCOBALAMIN Take 1,000 mcg by mouth daily.        ROS:  A comprehensive review of systems  was negative except for: Cardiovascular: positive for varicose veins Gastrointestinal: positive for blood per rectum Neurological: positive for leg numbness  Blood pressure (!) 188/92, pulse 87, temperature (!) 97.5 F (36.4 C), resp. rate 18, height 6\' 2"  (1.88 m), weight 226 lb (102.5 kg). Physical Exam  Constitutional: He is oriented to person, place, and time and well-developed, well-nourished, and in no distress.  HENT:  Head: Normocephalic.  Eyes: Pupils are equal, round, and reactive to light.  Neck: Normal range of motion.  Cardiovascular: Normal rate and regular rhythm.  Pulmonary/Chest: Effort normal and breath sounds normal.  Abdominal: Soft. He exhibits no distension. There is no tenderness.    Genitourinary:  Genitourinary Comments: No external hemorrhoids on exam, unable to palpate any lesion internally, no gross blood, normal tone  Musculoskeletal: Normal range of motion. He exhibits no edema.  Neurological: He is alert and oriented to person, place, and time.  Skin: Skin is warm and dry.  Psychiatric: Mood, memory, affect and judgment normal.  Vitals reviewed.   Results: Colonoscopy polypoid lesion and internal hemorrhoids     Assessment & Plan:  Kenneth Stone is a 52 y.o. male with known hemorrhoids and bleeding with a lesion of unknown significance in the anal canal/ rectal junction. The patient is having some bleeding regularly.   -We have discussed that the etiology of this lesion is unknown and the pathology is unknown and that it could be a cancer or a "wart" type lesion that needs surveillance. The patient also has internal hemorrhoids that are bleeding.   -We discussed the risk and benefits of an exam under anesthesia and internal hemorrhoid surgery/ essential ligation. The internal hemorrhoids are not painful to remove because the same nerves are not involved, and the sensation is different, given this I do not expect he will have significant pain from removal of the lesion or the internal hemorrhoids.   The risk of of exam under anesthesia, ligation of the internal hemorrhoid, and possible resection of the lesion, include bleeding, risk of infection although rare, and the risk of narrowing the anal canal if too much tissue is removed. Given this risk, it is likely that only the 2 largest hemorrhoid columns would be removed during the initial surgery.  We have also discussed the risk of incontinence after surgery if the muscles were injured, and although this is rare that it can happen and is another reason to limit the amount of hemorrhoids removed.  Discussed that if this is cancer, that he will require further treatments, and if it is a dysplastic lesion/ wart type  lesion that he will need to have surveillance.   All questions were answered to the satisfaction of the patient.  After careful consideration, Kenneth Stone has decided to proceed.   Virl Cagey 02/19/2017, 4:18 PM

## 2017-03-06 ENCOUNTER — Encounter: Payer: Self-pay | Admitting: Pediatrics

## 2017-03-06 ENCOUNTER — Ambulatory Visit: Payer: BLUE CROSS/BLUE SHIELD | Admitting: Pediatrics

## 2017-03-06 VITALS — BP 122/83 | HR 67 | Temp 97.3°F | Ht 74.0 in | Wt 225.2 lb

## 2017-03-06 DIAGNOSIS — R6889 Other general symptoms and signs: Secondary | ICD-10-CM

## 2017-03-06 DIAGNOSIS — J029 Acute pharyngitis, unspecified: Secondary | ICD-10-CM

## 2017-03-06 DIAGNOSIS — J069 Acute upper respiratory infection, unspecified: Secondary | ICD-10-CM

## 2017-03-06 LAB — VERITOR FLU A/B WAIVED
INFLUENZA B: NEGATIVE
Influenza A: NEGATIVE

## 2017-03-06 LAB — CULTURE, GROUP A STREP

## 2017-03-06 LAB — RAPID STREP SCREEN (MED CTR MEBANE ONLY): Strep Gp A Ag, IA W/Reflex: NEGATIVE

## 2017-03-06 NOTE — Progress Notes (Signed)
  Subjective:   Patient ID: Kenneth Stone, male    DOB: 10/03/1965, 52 y.o.   MRN: [id number] CC: Sore Throat; Fever; and Ear Pain  HPI: Kenneth Stone is a 52 y.o. male presenting for Sore Throat; Fever; and Ear Pain  Started 2-3 days ago with nasal congestion, some coughing Has had subjective fevers, chills, aches Some dry coughing L ear has been bothering him Throat has been bothering him Appetite has been down Has not had stool for past few days Says he ahs been taking miralax with minimal improvement  Relevant past medical, surgical, family and social history reviewed. Allergies and medications reviewed and updated. Social History   Tobacco Use  Smoking Status Never Smoker  Smokeless Tobacco Never Used   ROS: Per HPI   Objective:    BP 122/83   Pulse 67   Temp (!) 97.3 F (36.3 C) (Oral)   Ht 6\' 2"  (1.88 m)   Wt 225 lb 3.2 oz (102.2 kg)   BMI 28.91 kg/m   Wt Readings from Last 3 Encounters:  03/06/17 225 lb 3.2 oz (102.2 kg)  02/19/17 226 lb (102.5 kg)  01/23/17 228 lb (103.4 kg)    Gen: NAD, alert, cooperative with exam, NCAT EYES: EOMI, no conjunctival injection, or no icterus ENT:  TMs pearly gray b/l, OP with mild erythema LYMPH: no cervical LAD CV: NRRR, normal S1/S2, no murmur, distal pulses 2+ b/l Resp: CTABL, no wheezes, normal WOB Abd: +BS, soft, NTND. no guarding or organomegaly Ext: No edema, warm Neuro: Alert and oriented, strength equal b/l UE and LE, coordination grossly normal MSK: normal muscle bulk  Assessment & Plan:  Amaris was seen today for sore throat, fever and ear pain.  Diagnoses and all orders for this visit:  Sore throat Strep neg -     Rapid Strep Screen (Not at [facility]) -     Cancel: Culture, Group A Strep -     Culture, Group A Strep  Flu-like symptoms Flu neg -     Veritor Flu A/B Waived  Acute URI Discussed symptom care, return precautions  Follow up plan: prn Assunta Found, MD [place]

## 2017-03-06 NOTE — Patient Instructions (Signed)
Nasal congestion: Netipot with distilled water 2-3 times a day to clear out sinuses Or Normal saline nasal spray Flonase steroid nasal spray  For sore throat can use: Ibuprofen 400- 600mg  three times a day Throat lozenges chloroseptic spray  Stick with bland foods Drink lots of fluids

## 2017-03-08 LAB — CULTURE, GROUP A STREP: Strep A Culture: NEGATIVE

## 2017-03-13 NOTE — Patient Instructions (Signed)
Kenneth Stone  03/13/2017     @PREFPERIOPPHARMACY @   Your procedure is scheduled on  03/18/2017   Report to [facility] at  615  A.M.  Call this number if you have problems the morning of surgery:  [phone]   Remember:  Do not eat food or drink liquids after midnight.  Take these medicines the morning of surgery with A SIP OF WATER  Protonix, accupril.   Do not wear jewelry, make-up or nail polish.  Do not wear lotions, powders, or perfumes, or deodorant.  Do not shave 48 hours prior to surgery.  Men may shave face and neck.  Do not bring valuables to the hospital.  [facility] is not responsible for any belongings or valuables.  Contacts, dentures or bridgework may not be worn into surgery.  Leave your suitcase in the car.  After surgery it may be brought to your room.  For patients admitted to the hospital, discharge time will be determined by your treatment team.  Patients discharged the day of surgery will not be allowed to drive home.   Name and phone number of your driver:   family Special instructions:  None  Please read over the following fact sheets that you were given. Anesthesia Post-op Instructions and Care and Recovery After Surgery      Surgical Procedures for Hemorrhoids, Care After Refer to this sheet in the next few weeks. These instructions provide you with information about caring for yourself after your procedure. Your health care provider may also give you more specific instructions. Your treatment has been planned according to current medical practices, but problems sometimes occur. Call your health care provider if you have any problems or questions after your procedure. What can I expect after the procedure? After the procedure, it is common to have:  Rectal pain.  Pain when you are having a bowel movement.  Slight rectal bleeding.  Follow these instructions at home: Medicines  Take over-the-counter and prescription medicines  only as told by your health care provider.  Do not drive or operate heavy machinery while taking prescription pain medicine.  Use a stool softener or a bulk laxative as told by your health care provider. Activity  Rest at home. Return to your normal activities as told by your health care provider.  Do not lift anything that is heavier than 10 lb (4.5 kg).  Do not sit for long periods of time. Take a walk every day or as told by your health care provider.  Do not strain to have a bowel movement. Do not spend a long time sitting on the toilet. Eating and drinking  Eat foods that contain fiber, such as whole grains, beans, nuts, fruits, and vegetables.  Drink enough fluid to keep your urine clear or pale yellow. General instructions  Sit in a warm bath 2-3 times per day to relieve soreness or itching.  Keep all follow-up visits as told by your health care provider. This is important. Contact a health care provider if:  Your pain medicine is not helping.  You have a fever or chills.  You become constipated.  You have trouble passing urine. Get help right away if:  You have very bad rectal pain.  You have heavy bleeding from your rectum. This information is not intended to replace advice given to you by your health care provider. Make sure you discuss any questions you have with your health care provider. Document Released: 04/05/2003  Document Revised: 06/21/2015 Document Reviewed: 04/10/2014 Elsevier Interactive Patient Education  2018 [place] Anesthesia, Adult General anesthesia is the use of medicines to make a person "go to sleep" (be unconscious) for a medical procedure. General anesthesia is often recommended when a procedure:  Is long.  Requires you to be still or in an unusual position.  Is major and can cause you to lose blood.  Is impossible to do without general anesthesia.  The medicines used for general anesthesia are called general  anesthetics. In addition to making you sleep, the medicines:  Prevent pain.  Control your blood pressure.  Relax your muscles.  Tell a health care provider about:  Any allergies you have.  All medicines you are taking, including vitamins, herbs, eye drops, creams, and over-the-counter medicines.  Any problems you or family members have had with anesthetic medicines.  Types of anesthetics you have had in the past.  Any bleeding disorders you have.  Any surgeries you have had.  Any medical conditions you have.  Any history of heart or lung conditions, such as heart failure, sleep apnea, or chronic obstructive pulmonary disease (COPD).  Whether you are pregnant or may be pregnant.  Whether you use tobacco, alcohol, marijuana, or street drugs.  Any history of Armed forces logistics/support/administrative officer.  Any history of depression or anxiety. What are the risks? Generally, this is a safe procedure. However, problems may occur, including:  Allergic reaction to anesthetics.  Lung and heart problems.  Inhaling food or liquids from your stomach into your lungs (aspiration).  Injury to nerves.  Waking up during your procedure and being unable to move (rare).  Extreme agitation or a state of mental confusion (delirium) when you wake up from the anesthetic.  Air in the bloodstream, which can lead to stroke.  These problems are more likely to develop if you are having a major surgery or if you have an advanced medical condition. You can prevent some of these complications by answering all of your health care provider's questions thoroughly and by following all pre-procedure instructions. General anesthesia can cause side effects, including:  Nausea or vomiting  A sore throat from the breathing tube.  Feeling cold or shivery.  Feeling tired, washed out, or achy.  Sleepiness or drowsiness.  Confusion or agitation.  What happens before the procedure? Staying hydrated Follow instructions from  your health care provider about hydration, which may include:  Up to 2 hours before the procedure - you may continue to drink clear liquids, such as water, clear fruit juice, black coffee, and plain tea.  Eating and drinking restrictions Follow instructions from your health care provider about eating and drinking, which may include:  8 hours before the procedure - stop eating heavy meals or foods such as meat, fried foods, or fatty foods.  6 hours before the procedure - stop eating light meals or foods, such as toast or cereal.  6 hours before the procedure - stop drinking milk or drinks that contain milk.  2 hours before the procedure - stop drinking clear liquids.  Medicines  Ask your health care provider about: ? Changing or stopping your regular medicines. This is especially important if you are taking diabetes medicines or blood thinners. ? Taking medicines such as aspirin and ibuprofen. These medicines can thin your blood. Do not take these medicines before your procedure if your health care provider instructs you not to. ? Taking new dietary supplements or medicines. Do not take these during the week  before your procedure unless your health care provider approves them.  If you are told to take a medicine or to continue taking a medicine on the day of the procedure, take the medicine with sips of water. General instructions   Ask if you will be going home the same day, the following day, or after a longer hospital stay. ? Plan to have someone take you home. ? Plan to have someone stay with you for the first 24 hours after you leave the hospital or clinic.  For 3-6 weeks before the procedure, try not to use any tobacco products, such as cigarettes, chewing tobacco, and e-cigarettes.  You may brush your teeth on the morning of the procedure, but make sure to spit out the toothpaste. What happens during the procedure?  You will be given anesthetics through a mask and through an  IV tube in one of your veins.  You may receive medicine to help you relax (sedative).  As soon as you are asleep, a breathing tube may be used to help you breathe.  An anesthesia specialist will stay with you throughout the procedure. He or she will help keep you comfortable and safe by continuing to give you medicines and adjusting the amount of medicine that you get. He or she will also watch your blood pressure, pulse, and oxygen levels to make sure that the anesthetics do not cause any problems.  If a breathing tube was used to help you breathe, it will be removed before you wake up. The procedure may vary among health care providers and hospitals. What happens after the procedure?  You will wake up, often slowly, after the procedure is complete, usually in a recovery area.  Your blood pressure, heart rate, breathing rate, and blood oxygen level will be monitored until the medicines you were given have worn off.  You may be given medicine to help you calm down if you feel anxious or agitated.  If you will be going home the same day, your health care provider may check to make sure you can stand, drink, and urinate.  Your health care providers will treat your pain and side effects before you go home.  Do not drive for 24 hours if you received a sedative.  You may: ? Feel nauseous and vomit. ? Have a sore throat. ? Have mental slowness. ? Feel cold or shivery. ? Feel sleepy. ? Feel tired. ? Feel sore or achy, even in parts of your body where you did not have surgery. This information is not intended to replace advice given to you by your health care provider. Make sure you discuss any questions you have with your health care provider. Document Released: 04/22/2007 Document Revised: 06/26/2015 Document Reviewed: 12/28/2014 Elsevier Interactive Patient Education  2018 [place] Anesthesia, Adult, Care After These instructions provide you with information about caring  for yourself after your procedure. Your health care provider may also give you more specific instructions. Your treatment has been planned according to current medical practices, but problems sometimes occur. Call your health care provider if you have any problems or questions after your procedure. What can I expect after the procedure? After the procedure, it is common to have:  Vomiting.  A sore throat.  Mental slowness.  It is common to feel:  Nauseous.  Cold or shivery.  Sleepy.  Tired.  Sore or achy, even in parts of your body where you did not have surgery.  Follow these instructions at home: For  at least 24 hours after the procedure:  Do not: ? Participate in activities where you could fall or become injured. ? Drive. ? Use heavy machinery. ? Drink alcohol. ? Take sleeping pills or medicines that cause drowsiness. ? Make important decisions or sign legal documents. ? Take care of children on your own.  Rest. Eating and drinking  If you vomit, drink water, juice, or soup when you can drink without vomiting.  Drink enough fluid to keep your urine clear or pale yellow.  Make sure you have little or no nausea before eating solid foods.  Follow the diet recommended by your health care provider. General instructions  Have a responsible adult stay with you until you are awake and alert.  Return to your normal activities as told by your health care provider. Ask your health care provider what activities are safe for you.  Take over-the-counter and prescription medicines only as told by your health care provider.  If you smoke, do not smoke without supervision.  Keep all follow-up visits as told by your health care provider. This is important. Contact a health care provider if:  You continue to have nausea or vomiting at home, and medicines are not helpful.  You cannot drink fluids or start eating again.  You cannot urinate after 8-12 hours.  You develop a  skin rash.  You have fever.  You have increasing redness at the site of your procedure. Get help right away if:  You have difficulty breathing.  You have chest pain.  You have unexpected bleeding.  You feel that you are having a life-threatening or urgent problem. This information is not intended to replace advice given to you by your health care provider. Make sure you discuss any questions you have with your health care provider. Document Released: 04/21/2000 Document Revised: 06/18/2015 Document Reviewed: 12/28/2014 Elsevier Interactive Patient Education  [employer].

## 2017-03-16 ENCOUNTER — Encounter (HOSPITAL_COMMUNITY): Payer: Self-pay

## 2017-03-16 ENCOUNTER — Encounter (HOSPITAL_COMMUNITY)
Admission: RE | Admit: 2017-03-16 | Discharge: 2017-03-16 | Disposition: A | Discharge status: Home / Self Care | Payer: BLUE CROSS/BLUE SHIELD | Source: Ambulatory Visit | Attending: General Surgery | Admitting: General Surgery

## 2017-03-17 ENCOUNTER — Telehealth: Payer: Self-pay | Admitting: General Surgery

## 2017-03-17 NOTE — Telephone Encounter (Addendum)
[facility]  Patient has canceled his exam under anesthesia with tissue sampling/ hemorrhoidectomy. He had a polypoid lesion on colonoscopy that needs to be looked at an sampled and was just proximal to his hemorrhoid column.   Called to discuss need for this surgery.  Have tried him multiple times and on multiple phones. His home phone has no voicemail, and I finally have reached him.  He says that the insurance company cannot tell him what they will pay. He wants to check again with his insurance company before proceeding with the surgery.   He does not have the funds to have a lot out of pocket at this time. He does understand the need to do the exam and biopsy to assess for any area that could be malignant.   He will call us back in the next few days.   Curlene Labrum, MD [facility] [address] [place], [place] [address] [phone] (office)

## 2017-03-18 ENCOUNTER — Ambulatory Visit (HOSPITAL_COMMUNITY)
Admission: RE | Admit: 2017-03-18 | Payer: BLUE CROSS/BLUE SHIELD | Source: Ambulatory Visit | Admitting: General Surgery

## 2017-03-18 ENCOUNTER — Encounter (HOSPITAL_COMMUNITY): Admission: RE | Payer: Self-pay | Source: Ambulatory Visit

## 2017-03-18 SURGERY — HEMORRHOIDECTOMY
Anesthesia: Choice

## 2017-04-07 ENCOUNTER — Other Ambulatory Visit: Payer: Self-pay | Admitting: Family Medicine

## 2017-04-07 MED ORDER — ATORVASTATIN CALCIUM 20 MG PO TABS: 20.0000 mg | ORAL_TABLET | Freq: Every day | ORAL | 0 refills | Status: DC

## 2017-04-21 ENCOUNTER — Telehealth: Payer: Self-pay | Admitting: General Surgery

## 2017-04-21 NOTE — Telephone Encounter (Signed)
[facility]   Patient with a lot going on. He has not called the insurance company.  Brought up the lesion that Dr. Laural Golden worried about in the anal region. He understands that this could be a precancerous lesion, etc and that it needs biopsied.   He is going to get in touch with this insurance.  Curlene Labrum, MD [facility] [address] [place], [place] [address] [phone] (office)

## 2017-05-01 ENCOUNTER — Ambulatory Visit: Payer: BLUE CROSS/BLUE SHIELD | Admitting: Family Medicine

## 2017-05-01 ENCOUNTER — Encounter: Payer: Self-pay | Admitting: Family Medicine

## 2017-05-01 VITALS — BP 127/75 | HR 85 | Temp 101.4°F | Ht 74.0 in | Wt 226.8 lb

## 2017-05-01 DIAGNOSIS — R6889 Other general symptoms and signs: Secondary | ICD-10-CM | POA: Diagnosis not present

## 2017-05-01 LAB — VERITOR FLU A/B WAIVED
Influenza A: NEGATIVE
Influenza B: NEGATIVE

## 2017-05-01 MED ORDER — AZITHROMYCIN 250 MG PO TABS: ORAL_TABLET | ORAL | 0 refills | Status: DC

## 2017-05-01 MED ORDER — KOMBIGLYZE XR 2.5-1000 MG PO TB24: 1.0000 | ORAL_TABLET | Freq: Two times a day (BID) | ORAL | 5 refills | Status: DC

## 2017-05-01 MED ORDER — PANTOPRAZOLE SODIUM 40 MG PO TBEC: 40.0000 mg | DELAYED_RELEASE_TABLET | Freq: Every day | ORAL | 3 refills | Status: DC

## 2017-05-01 MED ORDER — GLUCOSE BLOOD VI STRP
ORAL_STRIP | 12 refills | Status: DC
Start: 2017-05-01 — End: 2021-03-13

## 2017-05-01 MED ORDER — HYDROCODONE-HOMATROPINE 5-1.5 MG/5ML PO SYRP: 5.0000 mL | ORAL_SOLUTION | Freq: Four times a day (QID) | ORAL | 0 refills | Status: DC | PRN

## 2017-05-01 MED ORDER — OSELTAMIVIR PHOSPHATE 75 MG PO CAPS: 75.0000 mg | ORAL_CAPSULE | Freq: Two times a day (BID) | ORAL | 0 refills | Status: DC

## 2017-05-01 NOTE — Patient Instructions (Signed)

## 2017-05-01 NOTE — Progress Notes (Signed)
   HPI  Patient presents today here with illness.  Patient explains he has had illness for about 1 day.  He reports sore throat, cough, body aches. Patient states he also has malaise. He is tried Alka-Seltzer and Tylenol sinus without good improvement.  He is tolerating fluids and foods well, however has decreased appetite.  Reports getting plenty of fluids today.  Patient also needs refills of diabetic medications, states he will return in 2-4 weeks for follow-up diabetes.  PMH: Smoking status noted ROS: Per HPI  Objective: BP 127/75   Pulse 85   Temp (!) 101.4 F (38.6 C) (Oral)   Ht 6\' 2"  (1.88 m)   Wt 226 lb 12.8 oz (102.9 kg)   SpO2 100%   BMI 29.12 kg/m  Gen: NAD, alert, cooperative with exam HEENT: NCAT, oropharynx moist and clear, nares with erythema and boggy mucosa, TMs normal CV: RRR, good S1/S2, no murmur Resp: CTABL, no wheezes, non-labored Ext: No edema, warm Neuro: Alert and oriented, No gross deficits  Assessment and plan:  #Flulike illness Patient with febrile illness today in clinic, rapid flu testing is negative, however I think it is very consistent with influenza. Tamiflu given, given negative test and persistent cough with some wheezing I have gone ahead and covered with azithromycin as well. Hycodan cough syrup for nighttime cough, reviewed no driving after taking it.    Orders Placed This Encounter  Procedures  . Veritor Flu A/B Waived    Order Specific Question:   Source    Answer:   nasal    Meds ordered this encounter  Medications  . glucose blood test strip    Sig: Use as instructed    Dispense:  100 each    Refill:  12  . KOMBIGLYZE XR 2.05-998 MG TB24    Sig: Take 1 tablet by mouth 2 (two) times daily.    Dispense:  30 tablet    Refill:  5  . pantoprazole (PROTONIX) 40 MG tablet    Sig: Take 1 tablet (40 mg total) by mouth daily.    Dispense:  90 tablet    Refill:  3  . HYDROcodone-homatropine (HYCODAN) 5-1.5 MG/5ML syrup    Sig: Take 5 mLs by mouth every 6 (six) hours as needed for cough.    Dispense:  120 mL    Refill:  0  . oseltamivir (TAMIFLU) 75 MG capsule    Sig: Take 1 capsule (75 mg total) by mouth 2 (two) times daily.    Dispense:  10 capsule    Refill:  0  . azithromycin (ZITHROMAX) 250 MG tablet    Sig: Take 2 tablets on day 1 and 1 tablet daily after that    Dispense:  6 tablet    Refill:  0    Laroy Apple, MD [facility] 05/01/2017, 11:33 AM

## 2017-05-18 ENCOUNTER — Other Ambulatory Visit: Payer: Self-pay | Admitting: Family Medicine

## 2017-05-28 ENCOUNTER — Encounter: Payer: Self-pay | Admitting: Family Medicine

## 2017-05-28 ENCOUNTER — Ambulatory Visit: Payer: BLUE CROSS/BLUE SHIELD | Admitting: Family Medicine

## 2017-05-28 VITALS — BP 133/80 | HR 75 | Temp 97.4°F | Ht 74.0 in | Wt 225.2 lb

## 2017-05-28 DIAGNOSIS — E119 Type 2 diabetes mellitus without complications: Secondary | ICD-10-CM | POA: Diagnosis not present

## 2017-05-28 DIAGNOSIS — K219 Gastro-esophageal reflux disease without esophagitis: Secondary | ICD-10-CM

## 2017-05-28 DIAGNOSIS — I83813 Varicose veins of bilateral lower extremities with pain: Secondary | ICD-10-CM | POA: Diagnosis not present

## 2017-05-28 LAB — BAYER DCA HB A1C WAIVED: HB A1C (BAYER DCA - WAIVED): 6.9 % (ref <7.0)

## 2017-05-28 NOTE — Progress Notes (Signed)
HPI  Patient presents today for follow up diabetes, GERD, and vericose veins  Diabetes Patient's average fasting blood sugar has been 120-140, no hypoglycemia. Good medication compliance.  GERD Patient states he has had breakthrough GERD with certain acidic drinks over the last week or so.  He continues to take Protonix. He describes some discomfort in the left chest which she associates with GERD, this actually improves with activity.  Varicose veins Present for greater than 2 years, previously improving with compression stockings, however they are difficult for him to tolerate due to persistent falling down. He request vascular surgery consult to consider surgery. He states that he is got more discomfort in his left leg than his right  PMH: Smoking status noted ROS: Per HPI  Objective: BP 133/80   Pulse 75   Temp (!) 97.4 F (36.3 C) (Oral)   Ht 6' 2" (1.88 m)   Wt 225 lb 3.2 oz (102.2 kg)   BMI 28.91 kg/m  Gen: NAD, alert, cooperative with exam HEENT: NCAT CV: RRR, good S1/S2, no murmur Resp: CTABL, no wheezes, non-labored Ext: Lateral varicose veins, left greater than right, tortuous appearing engorged vessels of the left upper leg, left medial knee, left lower leg, and right upper leg just above the knee, no significant edema Neuro: Alert and oriented, No gross deficits  Assessment and plan:  #Type 2 diabetes Expect good control No change to Kombiglyze for now   #Varicose veins Refer to vascular surgery Recommended trying compression stockings again   #GERD Breakthrough symptoms, explained that he should continue PPI and can take Zantac or other H2 blocker on top as needed    Orders Placed This Encounter  Procedures  . Bayer DCA Hb A1c Waived  . CMP14+EGFR  . CBC with Differential/Platelet  . Lipid panel  . Ambulatory referral to Vascular Surgery    Referral Priority:   Routine    Referral Type:   Surgical    Referral Reason:   Specialty Services  Required    Requested Specialty:   Vascular Surgery    Number of Visits Requested:   1    Sam Bradshaw, MD [facility] 05/28/2017, 4:50 PM     

## 2017-05-28 NOTE — Patient Instructions (Signed)
Great to see you!  For the GERD ( heartburn) continue protonix and use an extra zantac or pepcid as needed up to twice daily.

## 2017-05-29 LAB — CBC WITH DIFFERENTIAL/PLATELET
BASOS: 0 %
Basophils Absolute: 0 10*3/uL (ref 0.0–0.2)
EOS (ABSOLUTE): 0.3 10*3/uL (ref 0.0–0.4)
Eos: 3 %
HEMATOCRIT: 37.5 % (ref 37.5–51.0)
HEMOGLOBIN: 12.2 g/dL — AB (ref 13.0–17.7)
Immature Grans (Abs): 0 10*3/uL (ref 0.0–0.1)
Immature Granulocytes: 0 %
LYMPHS: 36 %
Lymphocytes Absolute: 3.3 10*3/uL — ABNORMAL HIGH (ref 0.7–3.1)
MCH: 25.1 pg — ABNORMAL LOW (ref 26.6–33.0)
MCHC: 32.5 g/dL (ref 31.5–35.7)
MCV: 77 fL — AB (ref 79–97)
Monocytes Absolute: 0.8 10*3/uL (ref 0.1–0.9)
Monocytes: 9 %
NEUTROS PCT: 52 %
Neutrophils Absolute: 4.9 10*3/uL (ref 1.4–7.0)
Platelets: 224 10*3/uL (ref 150–379)
RBC: 4.87 x10E6/uL (ref 4.14–5.80)
RDW: 16.5 % — ABNORMAL HIGH (ref 12.3–15.4)
WBC: 9.4 10*3/uL (ref 3.4–10.8)

## 2017-05-29 LAB — CMP14+EGFR
A/G RATIO: 1.2 (ref 1.2–2.2)
ALT: 41 IU/L (ref 0–44)
AST: 42 IU/L — AB (ref 0–40)
Albumin: 4.3 g/dL (ref 3.5–5.5)
Alkaline Phosphatase: 50 IU/L (ref 39–117)
BUN/Creatinine Ratio: 15 (ref 9–20)
BUN: 11 mg/dL (ref 6–24)
Bilirubin Total: 0.4 mg/dL (ref 0.0–1.2)
CALCIUM: 9.6 mg/dL (ref 8.7–10.2)
CO2: 21 mmol/L (ref 20–29)
CREATININE: 0.73 mg/dL — AB (ref 0.76–1.27)
Chloride: 103 mmol/L (ref 96–106)
GFR calc Af Amer: 122 mL/min/{1.73_m2} (ref >59)
GFR, EST NON AFRICAN AMERICAN: 106 mL/min/{1.73_m2} (ref >59)
Globulin, Total: 3.7 g/dL (ref 1.5–4.5)
Glucose: 159 mg/dL — ABNORMAL HIGH (ref 65–99)
POTASSIUM: 4.2 mmol/L (ref 3.5–5.2)
Sodium: 139 mmol/L (ref 134–144)
Total Protein: 8 g/dL (ref 6.0–8.5)

## 2017-05-29 LAB — LIPID PANEL
CHOL/HDL RATIO: 3 ratio (ref 0.0–5.0)
Cholesterol, Total: 137 mg/dL (ref 100–199)
HDL: 46 mg/dL (ref >39)
LDL CALC: 77 mg/dL (ref 0–99)
TRIGLYCERIDES: 71 mg/dL (ref 0–149)
VLDL Cholesterol Cal: 14 mg/dL (ref 5–40)

## 2017-06-02 ENCOUNTER — Other Ambulatory Visit: Payer: Self-pay

## 2017-06-02 DIAGNOSIS — I8393 Asymptomatic varicose veins of bilateral lower extremities: Secondary | ICD-10-CM

## 2017-06-25 ENCOUNTER — Encounter: Payer: Self-pay | Admitting: *Deleted

## 2017-07-16 ENCOUNTER — Ambulatory Visit: Payer: BLUE CROSS/BLUE SHIELD | Admitting: Family

## 2017-07-16 ENCOUNTER — Encounter: Payer: Self-pay | Admitting: Family

## 2017-07-16 ENCOUNTER — Other Ambulatory Visit: Payer: Self-pay | Admitting: Family

## 2017-07-16 ENCOUNTER — Ambulatory Visit (INDEPENDENT_AMBULATORY_CARE_PROVIDER_SITE_OTHER): Payer: BLUE CROSS/BLUE SHIELD

## 2017-07-16 VITALS — BP 133/89 | HR 93 | Temp 98.6°F | Ht 74.0 in | Wt 227.0 lb

## 2017-07-16 DIAGNOSIS — M545 Low back pain, unspecified: Secondary | ICD-10-CM

## 2017-07-16 DIAGNOSIS — S20222A Contusion of left back wall of thorax, initial encounter: Secondary | ICD-10-CM | POA: Diagnosis not present

## 2017-07-16 DIAGNOSIS — W1789XA Other fall from one level to another, initial encounter: Secondary | ICD-10-CM

## 2017-07-16 DIAGNOSIS — Z23 Encounter for immunization: Secondary | ICD-10-CM | POA: Diagnosis not present

## 2017-07-16 DIAGNOSIS — S32010A Wedge compression fracture of first lumbar vertebra, initial encounter for closed fracture: Secondary | ICD-10-CM

## 2017-07-16 MED ORDER — HYDROCODONE-ACETAMINOPHEN 5-325 MG PO TABS
1.0000 | ORAL_TABLET | Freq: Two times a day (BID) | ORAL | 0 refills | Status: AC | PRN
Start: 2017-07-16 — End: 2018-07-16

## 2017-07-16 MED ORDER — BACLOFEN 10 MG PO TABS: 10.0000 mg | ORAL_TABLET | Freq: Three times a day (TID) | ORAL | 0 refills | Status: DC

## 2017-07-16 MED ORDER — DICLOFENAC SODIUM 75 MG PO TBEC: 75.0000 mg | DELAYED_RELEASE_TABLET | Freq: Two times a day (BID) | ORAL | 0 refills | Status: DC

## 2017-07-16 NOTE — Addendum Note (Signed)
Addended by: Shelbie Ammons on: 07/16/2017 12:18 PM   Modules accepted: Orders
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
## 2017-07-16 NOTE — Patient Instructions (Signed)
Contusion A contusion is a deep bruise. Contusions are the result of a blunt injury to tissues and muscle fibers under the skin. The injury causes bleeding under the skin. The skin overlying the contusion may turn blue, purple, or yellow. Minor injuries will give you a painless contusion, but more severe contusions may stay painful and swollen for a few weeks. What are the causes? This condition is usually caused by a blow, trauma, or direct force to an area of the body. What are the signs or symptoms? Symptoms of this condition include:  Swelling of the injured area.  Pain and tenderness in the injured area.  Discoloration. The area may have redness and then turn blue, purple, or yellow.  How is this diagnosed? This condition is diagnosed based on a physical exam and medical history. An X-ray, CT scan, or MRI may be needed to determine if there are any associated injuries, such as broken bones (fractures). How is this treated? Specific treatment for this condition depends on what area of the body was injured. In general, the best treatment for a contusion is resting, icing, applying pressure to (compression), and elevating the injured area. This is often called the RICE strategy. Over-the-counter anti-inflammatory medicines may also be recommended for pain control. Follow these instructions at home:  Rest the injured area.  If directed, apply ice to the injured area: ? Put ice in a plastic bag. ? Place a towel between your skin and the bag. ? Leave the ice on for 20 minutes, 2-3 times per day.  If directed, apply light compression to the injured area using an elastic bandage. Make sure the bandage is not wrapped too tightly. Remove and reapply the bandage as directed by your health care provider.  If possible, raise (elevate) the injured area above the level of your heart while you are sitting or lying down.  Take over-the-counter and prescription medicines only as told by your health  care provider. Contact a health care provider if:  Your symptoms do not improve after several days of treatment.  Your symptoms get worse.  You have difficulty moving the injured area. Get help right away if:  You have severe pain.  You have numbness in a hand or foot.  Your hand or foot turns pale or cold. This information is not intended to replace advice given to you by your health care provider. Make sure you discuss any questions you have with your health care provider. Document Released: 10/23/2004 Document Revised: 05/24/2015 Document Reviewed: 05/31/2014 Elsevier Interactive Patient Education  2018 [employer]  

## 2017-07-17 ENCOUNTER — Encounter: Payer: BLUE CROSS/BLUE SHIELD | Admitting: Vascular Surgery

## 2017-07-17 ENCOUNTER — Encounter (HOSPITAL_COMMUNITY): Payer: BLUE CROSS/BLUE SHIELD

## 2017-07-19 ENCOUNTER — Other Ambulatory Visit: Payer: Self-pay | Admitting: Family Medicine

## 2017-07-20 ENCOUNTER — Telehealth: Payer: Self-pay | Admitting: Family Medicine

## 2017-07-20 NOTE — Telephone Encounter (Signed)
Pt seen on 07/16/2017, refer to neurosurgery at that time.  Calling back today following up on referral.  States that he still in pain, I see that he was prescribed narcotic pain medications.  If medications have to be escalated I would recommend that he be seen.  Laroy Apple, MD [facility] 07/20/2017, 12:48 PM

## 2017-07-20 NOTE — Telephone Encounter (Signed)
Patient referred to Neurosurgeon but this takes at least a week to get scheduled  Patient states he is having a lot of pain

## 2017-07-21 ENCOUNTER — Ambulatory Visit: Payer: BLUE CROSS/BLUE SHIELD | Admitting: Family Medicine

## 2017-07-21 ENCOUNTER — Encounter: Payer: Self-pay | Admitting: Family Medicine

## 2017-07-21 VITALS — BP 140/89 | HR 68 | Temp 97.7°F | Ht 74.0 in | Wt 224.8 lb

## 2017-07-21 DIAGNOSIS — M545 Low back pain, unspecified: Secondary | ICD-10-CM

## 2017-07-21 DIAGNOSIS — S32010A Wedge compression fracture of first lumbar vertebra, initial encounter for closed fracture: Secondary | ICD-10-CM

## 2017-07-21 MED ORDER — METHYLPREDNISOLONE ACETATE 80 MG/ML IJ SUSP
80.0000 mg | Freq: Once | INTRAMUSCULAR | Status: AC
  Administered 2017-07-21: 80 mg via INTRAMUSCULAR

## 2017-07-21 MED ORDER — CYCLOBENZAPRINE HCL 10 MG PO TABS: 10.0000 mg | ORAL_TABLET | Freq: Every day | ORAL | 0 refills | Status: DC
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
## 2017-07-21 NOTE — Patient Instructions (Signed)
Great to see you!  We will work on changing your referral

## 2017-07-22 DIAGNOSIS — S32000A Wedge compression fracture of unspecified lumbar vertebra, initial encounter for closed fracture: Secondary | ICD-10-CM | POA: Diagnosis not present

## 2017-07-22 DIAGNOSIS — M545 Low back pain: Secondary | ICD-10-CM | POA: Diagnosis not present

## 2017-07-23 ENCOUNTER — Telehealth: Payer: Self-pay

## 2017-07-23 NOTE — Telephone Encounter (Signed)
Insurance denied Hydrocodone-acetaminophen   Limited to a 7 day supply

## 2017-07-23 NOTE — Telephone Encounter (Signed)
See phone note, pharmacy statesinsurance denied but it was filled days ago.   Laroy Apple, MD [facility] 07/23/2017, 12:46 PM

## 2017-07-27 ENCOUNTER — Other Ambulatory Visit (HOSPITAL_COMMUNITY): Payer: Self-pay | Admitting: Interventional Radiology

## 2017-07-27 DIAGNOSIS — S32010A Wedge compression fracture of first lumbar vertebra, initial encounter for closed fracture: Secondary | ICD-10-CM

## 2017-08-17 ENCOUNTER — Ambulatory Visit (HOSPITAL_COMMUNITY)
Admission: RE | Admit: 2017-08-17 | Discharge: 2017-08-17 | Disposition: A | Discharge status: Home / Self Care | Payer: BLUE CROSS/BLUE SHIELD | Source: Ambulatory Visit | Attending: Interventional Radiology | Admitting: Interventional Radiology

## 2017-08-17 DIAGNOSIS — S32010A Wedge compression fracture of first lumbar vertebra, initial encounter for closed fracture: Secondary | ICD-10-CM | POA: Diagnosis not present
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
## 2017-08-22 ENCOUNTER — Other Ambulatory Visit: Payer: Self-pay | Admitting: Family Medicine

## 2017-08-22 DIAGNOSIS — E119 Type 2 diabetes mellitus without complications: Secondary | ICD-10-CM

## 2017-08-24 NOTE — Telephone Encounter (Signed)
Can we verify that patient is actually taking this medication.  Last OV w/ [facility] for DM 05/2017 noted only Kombiglyze.

## 2017-08-24 NOTE — Telephone Encounter (Signed)
lmtcb 7/29-kc

## 2017-08-26 ENCOUNTER — Other Ambulatory Visit (HOSPITAL_COMMUNITY): Payer: Self-pay | Admitting: Interventional Radiology

## 2017-08-26 ENCOUNTER — Telehealth (HOSPITAL_COMMUNITY): Payer: Self-pay

## 2017-08-26 DIAGNOSIS — S32010A Wedge compression fracture of first lumbar vertebra, initial encounter for closed fracture: Secondary | ICD-10-CM

## 2017-08-26 NOTE — Telephone Encounter (Signed)
Pt would like to schedule procedure. He wants to call the pre-service center to find out his portion of the bill and then call back to schedule. AW

## 2017-09-03 ENCOUNTER — Telehealth (HOSPITAL_COMMUNITY): Payer: Self-pay

## 2017-09-03 ENCOUNTER — Encounter (HOSPITAL_COMMUNITY): Payer: Self-pay

## 2017-09-03 ENCOUNTER — Ambulatory Visit (HOSPITAL_COMMUNITY)
Admission: RE | Admit: 2017-09-03 | Discharge: 2017-09-03 | Disposition: A | Discharge status: Home / Self Care | Payer: BLUE CROSS/BLUE SHIELD | Source: Ambulatory Visit | Attending: Interventional Radiology | Admitting: Interventional Radiology

## 2017-09-03 NOTE — Telephone Encounter (Signed)
Pt called to cancel his vertebroplasty. He wants to try and let it heal first. He will call if he decides to reschedule. AW

## 2017-09-11 ENCOUNTER — Other Ambulatory Visit: Payer: Self-pay

## 2017-09-11 ENCOUNTER — Encounter: Payer: Self-pay | Admitting: Vascular Surgery

## 2017-09-11 ENCOUNTER — Ambulatory Visit (HOSPITAL_COMMUNITY)
Admission: RE | Admit: 2017-09-11 | Discharge: 2017-09-11 | Disposition: A | Discharge status: Home / Self Care | Payer: BLUE CROSS/BLUE SHIELD | Source: Ambulatory Visit | Attending: Vascular Surgery | Admitting: Vascular Surgery

## 2017-09-11 ENCOUNTER — Ambulatory Visit: Payer: BLUE CROSS/BLUE SHIELD | Admitting: Vascular Surgery

## 2017-09-11 VITALS — BP 132/88 | HR 76 | Resp 20 | Ht 74.0 in | Wt 226.0 lb

## 2017-09-11 DIAGNOSIS — I8393 Asymptomatic varicose veins of bilateral lower extremities: Secondary | ICD-10-CM

## 2017-09-11 DIAGNOSIS — I872 Venous insufficiency (chronic) (peripheral): Secondary | ICD-10-CM | POA: Diagnosis not present

## 2017-09-11 MED ORDER — ATORVASTATIN CALCIUM 20 MG PO TABS: 20.0000 mg | ORAL_TABLET | Freq: Every day | ORAL | 0 refills | Status: DC

## 2017-09-11 NOTE — Progress Notes (Signed)
Patient ID: Kenneth Stone, male   DOB: 10-Jun-1965, 52 y.o.   MRN: [id number]  Reason for Consult: New Patient (Initial Visit) (eval bil LE vv's - Dr. Wendi Snipes)   Referred by Kenneth Euler, MD  Subjective:     HPI:  [name] is a 52 y.o. male presents for bilateral lower extremity painful varicosities.  He has never had bleeding or thrombosis that he can remember.  He does not have a history of a DVT.  He does have significant family history of varicose veins.  He has never had intervention for these veins.  He does not wear compression stockings currently.  He has associated leg swelling.  Works on his feet every day running a store.  Does not take blood thinners.  Past Medical History:  Diagnosis Date  . Diabetes mellitus without complication ([place])   . Essential hypertension 09/05/2014  . GERD (gastroesophageal reflux disease)   . Hyperlipidemia 12/31/2014  . Spondylosis, cervical, with myelopathy 02/06/2016   Family History  Problem Relation Age of Onset  . Cancer Mother   . Heart disease Father    Past Surgical History:  Procedure Laterality Date  . COLONOSCOPY N/A 01/23/2017   Procedure: COLONOSCOPY;  Surgeon: Rogene Houston, MD;  Location[facility];  Service: Endoscopy;  Laterality: N/A;  1:00    Short Social History:  Social History   Tobacco Use  . Smoking status: Never Smoker  . Smokeless tobacco: Never Used  Substance Use Topics  . Alcohol use: Yes    Alcohol/week: 2.0 standard drinks    Types: 2 Standard drinks or equivalent per week    No Known Allergies  Current Outpatient Medications  Medication Sig Dispense Refill  . atorvastatin (LIPITOR) 20 MG tablet Take 1 tablet (20 mg total) by mouth daily. 90 tablet 0  . canagliflozin (INVOKANA) 100 MG TABS tablet Take 1 tablet (100 mg total) by mouth daily before breakfast. 90 tablet 3  . ferrous sulfate 325 (65 FE) MG tablet Take 1 tablet (325 mg total) by mouth daily with breakfast. 60 tablet 2  .  glipiZIDE (GLUCOTROL) 10 MG tablet TAKE 1 TABLET (10 MG TOTAL) BY MOUTH DAILY BEFORE BREAKFAST.  3  . glucose blood test strip Use as instructed 100 each 12  . KOMBIGLYZE XR 2.05-998 MG TB24 TAKE 1 TABLET BY MOUTH TWICE A DAY 180 tablet 0  . pantoprazole (PROTONIX) 40 MG tablet Take 1 tablet (40 mg total) by mouth daily. 90 tablet 3  . quinapril (ACCUPRIL) 40 MG tablet TAKE 1 TABLET (40 MG TOTAL) BY MOUTH AT BEDTIME. 90 tablet 1  . Wheat Dextrin (BENEFIBER DRINK MIX) PACK Take 4 g by mouth at bedtime.    . cyclobenzaprine (FLEXERIL) 10 MG tablet Take 1 tablet (10 mg total) by mouth at bedtime. (Patient not taking: Reported on 09/11/2017) 30 tablet 0  . diclofenac (VOLTAREN) 75 MG EC tablet Take 1 tablet (75 mg total) by mouth 2 (two) times daily. (Patient not taking: Reported on 09/11/2017) 30 tablet 0  . HYDROcodone-acetaminophen (NORCO/VICODIN) 5-325 MG tablet Take 1 tablet by mouth every 12 (twelve) hours as needed for moderate pain. (Patient not taking: Reported on 09/11/2017) 45 tablet 0   No current facility-administered medications for this visit.     Review of Systems  Constitutional:  Constitutional negative. HENT: HENT negative.  Eyes: Eyes negative.  Respiratory: Respiratory negative.  Cardiovascular: Positive for leg swelling.  Musculoskeletal:       Varicose veins with  leg pain Skin: Skin negative.  Neurological: Neurological negative. Hematologic: Hematologic/lymphatic negative.  Psychiatric: Psychiatric negative.        Objective:  Objective   Vitals:   09/11/17 1434  BP: 132/88  Pulse: 76  Resp: 20  SpO2: 98%  Weight: 226 lb (102.5 kg)  Height: 6\' 2"  (1.88 m)   Body mass index is 29.02 kg/m.  Physical Exam  Constitutional: He appears well-developed.  HENT:  Head: Normocephalic.  Eyes: Pupils are equal, round, and reactive to light.  Neck: Normal range of motion.  Cardiovascular: Normal rate.  Pulses:      Radial pulses are 2+ on the right side, and 2+  on the left side.       Dorsalis pedis pulses are 2+ on the right side, and 2+ on the left side.       Posterior tibial pulses are 2+ on the right side, and 2+ on the left side.  Pulmonary/Chest: Effort normal.  Abdominal: Soft.  Musculoskeletal: Normal range of motion.  Edema is 1+ pitting There is significant varicosities throughout his bilateral upper and lower extremities  Neurological: He is alert.  Skin: Skin is warm and dry.  Psychiatric: He has a normal mood and affect. His behavior is normal. Judgment and thought content normal.    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  He does have thigh-high compression stockings he is also given information on obtaining more should he need them.  He will wear thigh-high compression stockings and follow-up in 3 months to evaluate for symptom improvement.  I discussed that with him that at this level of varicosities he will will likely require saphenous vein ablation as well as intervention on his varicose veins.  He demonstrates good understanding we will get him set up for 3 month f/u.      Waynetta Sandy MD [facility]

## 2017-09-16 ENCOUNTER — Other Ambulatory Visit: Payer: Self-pay | Admitting: *Deleted

## 2017-09-16 MED ORDER — GLIPIZIDE 10 MG PO TABS: ORAL_TABLET | ORAL | 0 refills | Status: DC

## 2017-09-22 ENCOUNTER — Ambulatory Visit: Payer: BLUE CROSS/BLUE SHIELD | Admitting: Family Medicine

## 2017-10-09 ENCOUNTER — Telehealth (HOSPITAL_COMMUNITY): Payer: Self-pay

## 2017-10-15 ENCOUNTER — Other Ambulatory Visit: Payer: Self-pay | Admitting: *Deleted

## 2017-10-15 MED ORDER — KOMBIGLYZE XR 2.5-1000 MG PO TB24: 1.0000 | ORAL_TABLET | Freq: Two times a day (BID) | ORAL | 0 refills | Status: DC

## 2017-10-25 ENCOUNTER — Other Ambulatory Visit: Payer: Self-pay | Admitting: Family Medicine

## 2017-11-04 ENCOUNTER — Ambulatory Visit: Payer: BLUE CROSS/BLUE SHIELD | Admitting: Family Medicine

## 2017-11-10 ENCOUNTER — Other Ambulatory Visit: Payer: Self-pay | Admitting: Family Medicine

## 2017-11-15 ENCOUNTER — Other Ambulatory Visit: Payer: Self-pay | Admitting: Physician Assistant

## 2017-11-17 ENCOUNTER — Other Ambulatory Visit: Payer: Self-pay

## 2017-11-17 NOTE — Telephone Encounter (Signed)
Last seen 07/20/17  Dr Wendi Snipes

## 2017-11-18 ENCOUNTER — Ambulatory Visit: Payer: BLUE CROSS/BLUE SHIELD | Admitting: Family Medicine

## 2017-11-19 ENCOUNTER — Ambulatory Visit: Payer: BLUE CROSS/BLUE SHIELD | Admitting: Family Medicine

## 2017-11-24 DIAGNOSIS — M549 Dorsalgia, unspecified: Secondary | ICD-10-CM | POA: Diagnosis not present

## 2017-11-24 DIAGNOSIS — Z0000 Encounter for general adult medical examination without abnormal findings: Secondary | ICD-10-CM | POA: Diagnosis not present

## 2017-11-24 DIAGNOSIS — G8929 Other chronic pain: Secondary | ICD-10-CM | POA: Diagnosis not present

## 2017-11-24 DIAGNOSIS — S32010A Wedge compression fracture of first lumbar vertebra, initial encounter for closed fracture: Secondary | ICD-10-CM | POA: Diagnosis not present

## 2017-11-24 DIAGNOSIS — E1165 Type 2 diabetes mellitus with hyperglycemia: Secondary | ICD-10-CM | POA: Diagnosis not present

## 2017-11-26 ENCOUNTER — Other Ambulatory Visit: Payer: Self-pay

## 2017-11-26 MED ORDER — QUINAPRIL HCL 40 MG PO TABS: 40.0000 mg | ORAL_TABLET | Freq: Every day | ORAL | 0 refills | Status: DC

## 2017-12-07 DIAGNOSIS — E1165 Type 2 diabetes mellitus with hyperglycemia: Secondary | ICD-10-CM | POA: Diagnosis not present

## 2017-12-07 DIAGNOSIS — M5136 Other intervertebral disc degeneration, lumbar region: Secondary | ICD-10-CM | POA: Diagnosis not present

## 2017-12-07 DIAGNOSIS — I10 Essential (primary) hypertension: Secondary | ICD-10-CM | POA: Diagnosis not present

## 2017-12-07 DIAGNOSIS — M545 Low back pain: Secondary | ICD-10-CM | POA: Diagnosis not present

## 2017-12-09 ENCOUNTER — Ambulatory Visit: Payer: BLUE CROSS/BLUE SHIELD | Admitting: Vascular Surgery

## 2018-02-19 ENCOUNTER — Other Ambulatory Visit: Payer: Self-pay | Admitting: Family

## 2018-02-19 NOTE — Telephone Encounter (Signed)
Last seen 07/21/17

## 2018-03-16 DIAGNOSIS — E1165 Type 2 diabetes mellitus with hyperglycemia: Secondary | ICD-10-CM | POA: Diagnosis not present

## 2018-03-16 DIAGNOSIS — K219 Gastro-esophageal reflux disease without esophagitis: Secondary | ICD-10-CM | POA: Diagnosis not present

## 2018-03-16 DIAGNOSIS — E7800 Pure hypercholesterolemia, unspecified: Secondary | ICD-10-CM | POA: Diagnosis not present

## 2018-03-16 DIAGNOSIS — I10 Essential (primary) hypertension: Secondary | ICD-10-CM | POA: Diagnosis not present

## 2018-06-14 DIAGNOSIS — Z79899 Other long term (current) drug therapy: Secondary | ICD-10-CM | POA: Diagnosis not present

## 2018-06-14 DIAGNOSIS — E559 Vitamin D deficiency, unspecified: Secondary | ICD-10-CM | POA: Diagnosis not present

## 2018-06-14 DIAGNOSIS — E291 Testicular hypofunction: Secondary | ICD-10-CM | POA: Diagnosis not present

## 2018-06-14 DIAGNOSIS — E7800 Pure hypercholesterolemia, unspecified: Secondary | ICD-10-CM | POA: Diagnosis not present

## 2018-06-14 DIAGNOSIS — E1165 Type 2 diabetes mellitus with hyperglycemia: Secondary | ICD-10-CM | POA: Diagnosis not present

## 2018-07-12 DIAGNOSIS — M79672 Pain in left foot: Secondary | ICD-10-CM | POA: Diagnosis not present

## 2018-07-12 DIAGNOSIS — R0989 Other specified symptoms and signs involving the circulatory and respiratory systems: Secondary | ICD-10-CM | POA: Diagnosis not present

## 2018-07-12 DIAGNOSIS — E1165 Type 2 diabetes mellitus with hyperglycemia: Secondary | ICD-10-CM | POA: Diagnosis not present

## 2018-07-12 DIAGNOSIS — L84 Corns and callosities: Secondary | ICD-10-CM | POA: Diagnosis not present

## 2018-07-13 DIAGNOSIS — L089 Local infection of the skin and subcutaneous tissue, unspecified: Secondary | ICD-10-CM | POA: Diagnosis not present

## 2018-07-13 DIAGNOSIS — K219 Gastro-esophageal reflux disease without esophagitis: Secondary | ICD-10-CM | POA: Diagnosis not present

## 2018-07-13 DIAGNOSIS — E1165 Type 2 diabetes mellitus with hyperglycemia: Secondary | ICD-10-CM | POA: Diagnosis not present

## 2018-07-13 DIAGNOSIS — I10 Essential (primary) hypertension: Secondary | ICD-10-CM | POA: Diagnosis not present

## 2018-09-27 DIAGNOSIS — Z1159 Encounter for screening for other viral diseases: Secondary | ICD-10-CM | POA: Diagnosis not present

## 2018-09-27 DIAGNOSIS — I10 Essential (primary) hypertension: Secondary | ICD-10-CM | POA: Diagnosis not present

## 2018-09-27 DIAGNOSIS — Z79899 Other long term (current) drug therapy: Secondary | ICD-10-CM | POA: Diagnosis not present

## 2018-09-27 DIAGNOSIS — E7800 Pure hypercholesterolemia, unspecified: Secondary | ICD-10-CM | POA: Diagnosis not present

## 2018-09-27 DIAGNOSIS — E559 Vitamin D deficiency, unspecified: Secondary | ICD-10-CM | POA: Diagnosis not present

## 2018-09-27 DIAGNOSIS — E291 Testicular hypofunction: Secondary | ICD-10-CM | POA: Diagnosis not present

## 2018-09-27 DIAGNOSIS — E1165 Type 2 diabetes mellitus with hyperglycemia: Secondary | ICD-10-CM | POA: Diagnosis not present

## 2018-09-27 DIAGNOSIS — R300 Dysuria: Secondary | ICD-10-CM | POA: Diagnosis not present

## 2018-09-29 DIAGNOSIS — R600 Localized edema: Secondary | ICD-10-CM | POA: Diagnosis not present

## 2018-09-29 DIAGNOSIS — I83813 Varicose veins of bilateral lower extremities with pain: Secondary | ICD-10-CM | POA: Diagnosis not present

## 2018-10-01 ENCOUNTER — Other Ambulatory Visit: Payer: Self-pay | Admitting: Family Medicine
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
## 2018-12-30 DIAGNOSIS — K219 Gastro-esophageal reflux disease without esophagitis: Secondary | ICD-10-CM | POA: Diagnosis not present

## 2018-12-30 DIAGNOSIS — I8390 Asymptomatic varicose veins of unspecified lower extremity: Secondary | ICD-10-CM | POA: Diagnosis not present

## 2018-12-30 DIAGNOSIS — I10 Essential (primary) hypertension: Secondary | ICD-10-CM | POA: Diagnosis not present

## 2018-12-30 DIAGNOSIS — E1165 Type 2 diabetes mellitus with hyperglycemia: Secondary | ICD-10-CM | POA: Diagnosis not present

## 2021-02-11 ENCOUNTER — Encounter: Payer: Self-pay | Admitting: Family Medicine

## 2021-02-11 ENCOUNTER — Other Ambulatory Visit: Payer: Self-pay | Admitting: Family Medicine

## 2021-02-11 ENCOUNTER — Ambulatory Visit (INDEPENDENT_AMBULATORY_CARE_PROVIDER_SITE_OTHER): Payer: 59 | Admitting: Family Medicine

## 2021-02-11 ENCOUNTER — Other Ambulatory Visit: Payer: Self-pay

## 2021-02-11 VITALS — BP 136/85 | HR 85 | Temp 98.1°F | Wt 219.8 lb

## 2021-02-11 DIAGNOSIS — I10 Essential (primary) hypertension: Secondary | ICD-10-CM

## 2021-02-11 DIAGNOSIS — Z23 Encounter for immunization: Secondary | ICD-10-CM

## 2021-02-11 DIAGNOSIS — K219 Gastro-esophageal reflux disease without esophagitis: Secondary | ICD-10-CM | POA: Diagnosis not present

## 2021-02-11 DIAGNOSIS — E559 Vitamin D deficiency, unspecified: Secondary | ICD-10-CM

## 2021-02-11 DIAGNOSIS — E785 Hyperlipidemia, unspecified: Secondary | ICD-10-CM

## 2021-02-11 DIAGNOSIS — E119 Type 2 diabetes mellitus without complications: Secondary | ICD-10-CM

## 2021-02-11 MED ORDER — EMPAGLIFLOZIN 25 MG PO TABS: 25.0000 mg | ORAL_TABLET | Freq: Every day | ORAL | 3 refills | Status: DC

## 2021-02-11 MED ORDER — ATORVASTATIN CALCIUM 20 MG PO TABS: 20.0000 mg | ORAL_TABLET | Freq: Every day | ORAL | 3 refills | Status: DC

## 2021-02-11 MED ORDER — QUINAPRIL HCL 40 MG PO TABS: ORAL_TABLET | ORAL | 3 refills | Status: DC

## 2021-02-11 MED ORDER — KOMBIGLYZE XR 2.5-1000 MG PO TB24: 1.0000 | ORAL_TABLET | Freq: Two times a day (BID) | ORAL | 3 refills | Status: DC

## 2021-02-11 MED ORDER — PANTOPRAZOLE SODIUM 40 MG PO TBEC: 40.0000 mg | DELAYED_RELEASE_TABLET | Freq: Two times a day (BID) | ORAL | 3 refills | Status: DC

## 2021-02-11 MED ORDER — GLIPIZIDE 10 MG PO TABS: 10.0000 mg | ORAL_TABLET | Freq: Every day | ORAL | 3 refills | Status: DC

## 2021-02-11 NOTE — Progress Notes (Signed)
Subjective:  Patient ID: Kenneth Stone,  male    DOB: Apr 30, 1965  Age: 56 y.o.    CC: New Patient (Initial Visit)   HPI Kenneth Stone presents for  follow-up of hypertension. Patient has no history of headache chest pain or shortness of breath or recent cough. Patient also denies symptoms of TIA such as numbness weakness lateralizing. Patient denies side effects from medication. States taking it regularly.  Patient in for follow-up of GERD. Currently asymptomatic taking  PPI daily. There is no chest pain or heartburn. He reports generalized abdominal discomfort each morning when he first awakens that resolves as he gets ready to go to work.  No hematemesis and no melena. No dysphagia or choking. Onset is remote. Progression is stable. Complicating factors, none.   Patient also  in for follow-up of elevated cholesterol. Doing well without complaints on current medication. Denies side effects  including myalgia and arthralgia and nausea. Also in today for liver function testing. Currently no chest pain, shortness of breath or other cardiovascular related symptoms noted.  Follow-up of diabetes. Patient does check blood sugar at home. Readings run up to 240. Patient denies symptoms such as excessive hunger or urinary frequency, excessive hunger, nausea No significant hypoglycemic spells noted. Medications reviewed. Pt reports taking them regularly. Pt. denies complication/adverse reaction today.    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   He has a past surgical history that includes Colonoscopy (N/A, 01/23/2017).   His family history includes Cancer in his mother; Heart disease in his father.He reports that he has never smoked. He has never used smokeless tobacco. He reports current  alcohol use of about 2.0 standard drinks per week. He reports that he does not use drugs.  Current Outpatient Medications on File Prior to Visit  Medication Sig Dispense Refill   ferrous sulfate 325 (65 FE) MG tablet Take 1 tablet (325 mg total) by mouth daily with breakfast. 60 tablet 2   glucose blood test strip Use as instructed 100 each 12   Wheat Dextrin (BENEFIBER DRINK MIX) PACK Take 4 g by mouth at bedtime.     No current facility-administered medications on file prior to visit.    ROS Review of Systems  Constitutional: Negative.   HENT: Negative.    Eyes:  Negative for visual disturbance.  Respiratory:  Negative for cough and shortness of breath.   Cardiovascular:  Negative for chest pain and leg swelling.  Gastrointestinal:  Negative for abdominal pain, diarrhea, nausea and vomiting.  Genitourinary:  Negative for difficulty urinating.  Musculoskeletal:  Positive for myalgias (bilateral leg pain.). Negative for arthralgias.  Skin:  Negative for rash.  Neurological:  Negative for headaches.  Psychiatric/Behavioral:  Negative for sleep disturbance.    Objective:  BP 136/85    Pulse 85    Temp 98.1 F (36.7 C)    Wt 219 lb 12.8 oz (99.7 kg)    BMI 28.22 kg/m   BP Readings from Last 3 Encounters:  02/11/21 136/85  09/11/17 132/88  07/21/17 140/89    Wt Readings from Last 3 Encounters:  02/11/21 219 lb 12.8 oz (99.7 kg)  09/11/17 226 lb (102.5 kg)  07/21/17 224 lb 12.8 oz (102 kg)     Physical Exam Constitutional:      General: He is not in acute distress.    Appearance: He is well-developed.  HENT:     Head: Normocephalic and atraumatic.     Right Ear: External ear normal.     Left Ear: External ear normal.     Nose: Nose normal.  Eyes:     Conjunctiva/sclera: Conjunctivae normal.     Pupils: Pupils are equal, round, and reactive to light.  Cardiovascular:     Rate and Rhythm: Normal rate and regular rhythm.     Heart sounds: Normal heart sounds. No  murmur heard. Pulmonary:     Effort: Pulmonary effort is normal. No respiratory distress.     Breath sounds: Normal breath sounds. No wheezing or rales.  Abdominal:     Palpations: Abdomen is soft.     Tenderness: There is no abdominal tenderness.  Musculoskeletal:        General: Normal range of motion.     Cervical back: Normal range of motion and neck supple.  Skin:    General: Skin is warm and dry.  Neurological:     Mental Status: He is alert and oriented to person, place, and time.     Deep Tendon Reflexes: Reflexes are normal and symmetric.  Psychiatric:        Behavior: Behavior normal.        Thought Content: Thought content normal.        Judgment: Judgment normal.    Diabetic Foot Exam - Simple   No data filed       Assessment & Plan:   Kenneth Stone was seen today for new patient (initial visit).  Diagnoses and all orders for this visit:  Controlled type 2 diabetes mellitus without complication, without long-term current use of insulin (HCC) -     CBC with Differential/Platelet -     CMP14+EGFR -     Lipid panel -     Bayer DCA Hb A1c Waived  Need for shingles vaccine -     Varicella-zoster vaccine IM (Shingrix)  Essential hypertension -     CBC with Differential/Platelet -     CMP14+EGFR -     Lipid panel  Hyperlipidemia, unspecified hyperlipidemia type -     CBC with Differential/Platelet -     CMP14+EGFR -     Lipid panel  Vitamin D deficiency -     CBC with Differential/Platelet -     CMP14+EGFR -     VITAMIN D 25 Hydroxy (Vit-D Deficiency, Fractures)  Gastroesophageal reflux disease without esophagitis -     CBC with Differential/Platelet -     CMP14+EGFR  Other orders -     atorvastatin (LIPITOR) 20 MG tablet; Take 1 tablet (20 mg total) by mouth daily. -     empagliflozin (JARDIANCE) 25 MG TABS tablet; Take 1 tablet (25 mg total) by mouth daily. -     glipiZIDE (GLUCOTROL) 10 MG tablet; Take 1 tablet (10 mg total) by mouth daily before  breakfast. -     KOMBIGLYZE XR 2.05-998 MG TB24; Take 1 tablet by mouth 2 (two) times daily. (needs to be seen) -     pantoprazole (PROTONIX) 40 MG tablet; Take 1 tablet (40 mg total) by mouth 2 (two) times daily. -     quinapril (ACCUPRIL) 40 MG tablet; TAKE 1 TABLET BY MOUTH EVERYDAY AT BEDTIME   I have discontinued Inman Mancebo's canagliflozin, diclofenac, and cyclobenzaprine. I have also changed his empagliflozin and pantoprazole. Additionally, I am having him maintain his ferrous sulfate, Benefiber Drink Mix, glucose blood, atorvastatin, glipiZIDE, Kombiglyze XR, and quinapril.  Meds  ordered this encounter  Medications   atorvastatin (LIPITOR) 20 MG tablet    Sig: Take 1 tablet (20 mg total) by mouth daily.    Dispense:  90 tablet    Refill:  3   empagliflozin (JARDIANCE) 25 MG TABS tablet    Sig: Take 1 tablet (25 mg total) by mouth daily.    Dispense:  90 tablet    Refill:  3   glipiZIDE (GLUCOTROL) 10 MG tablet    Sig: Take 1 tablet (10 mg total) by mouth daily before breakfast.    Dispense:  90 tablet    Refill:  3   KOMBIGLYZE XR 2.05-998 MG TB24    Sig: Take 1 tablet by mouth 2 (two) times daily. (needs to be seen)    Dispense:  180 tablet    Refill:  3   pantoprazole (PROTONIX) 40 MG tablet    Sig: Take 1 tablet (40 mg total) by mouth 2 (two) times daily.    Dispense:  180 tablet    Refill:  3   quinapril (ACCUPRIL) 40 MG tablet    Sig: TAKE 1 TABLET BY MOUTH EVERYDAY AT BEDTIME    Dispense:  90 tablet    Refill:  3     Follow-up: Return in about 3 months (around 05/12/2021).  Claretta Fraise, M.D.

## 2021-02-12 NOTE — Telephone Encounter (Signed)
Key: [id number] - PA Case ID: [id number] - Rx #: [id number] Need help? Call us at [phone] Status Sent to Plan today Drug Jardiance 25MG  tablets

## 2021-02-13 ENCOUNTER — Telehealth: Payer: Self-pay | Admitting: Family Medicine

## 2021-02-13 ENCOUNTER — Other Ambulatory Visit: Payer: 59

## 2021-02-13 DIAGNOSIS — R799 Abnormal finding of blood chemistry, unspecified: Secondary | ICD-10-CM | POA: Diagnosis not present

## 2021-02-13 DIAGNOSIS — I10 Essential (primary) hypertension: Secondary | ICD-10-CM | POA: Diagnosis not present

## 2021-02-13 DIAGNOSIS — E785 Hyperlipidemia, unspecified: Secondary | ICD-10-CM | POA: Diagnosis not present

## 2021-02-13 DIAGNOSIS — E559 Vitamin D deficiency, unspecified: Secondary | ICD-10-CM | POA: Diagnosis not present

## 2021-02-13 DIAGNOSIS — K219 Gastro-esophageal reflux disease without esophagitis: Secondary | ICD-10-CM | POA: Diagnosis not present

## 2021-02-13 DIAGNOSIS — E119 Type 2 diabetes mellitus without complications: Secondary | ICD-10-CM | POA: Diagnosis not present

## 2021-02-13 LAB — BAYER DCA HB A1C WAIVED: HB A1C (BAYER DCA - WAIVED): 7.4 % — ABNORMAL HIGH (ref 4.8–5.6)

## 2021-02-13 MED ORDER — ONETOUCH VERIO W/DEVICE KIT: PACK | 0 refills | Status: DC

## 2021-02-13 NOTE — Telephone Encounter (Signed)
LMOVM to see what type of meter he has now & how many times a day he is testing

## 2021-02-13 NOTE — Telephone Encounter (Signed)
Aware new meter sent to pharmacy

## 2021-02-13 NOTE — Telephone Encounter (Signed)
One touch verio meter 2 times a day.

## 2021-02-13 NOTE — Telephone Encounter (Signed)
PA started for this

## 2021-02-13 NOTE — Telephone Encounter (Signed)
°  Prescription Request  02/13/2021  Is this a "Controlled Substance" medicine? no  Have you seen your PCP in the last 2 weeks? yes  If YES, route message to pool  -  If NO, patient needs to be scheduled for appointment.  What is the name of the medication or equipment? Blood sugar meter is not working and he needs new one called into pharmacy  Have you contacted your pharmacy to request a refill? no   Which pharmacy would you [facility]? [facility]   Patient notified that their request is being sent to the clinical staff for review and that they should receive a response within 2 business days.

## 2021-02-14 ENCOUNTER — Other Ambulatory Visit: Payer: Self-pay | Admitting: Family Medicine

## 2021-02-14 LAB — CBC WITH DIFFERENTIAL/PLATELET
Basophils Absolute: 0 10*3/uL (ref 0.0–0.2)
Basos: 0 %
EOS (ABSOLUTE): 0.4 10*3/uL (ref 0.0–0.4)
Eos: 4 %
Hematocrit: 37.7 % (ref 37.5–51.0)
Hemoglobin: 11.6 g/dL — ABNORMAL LOW (ref 13.0–17.7)
Immature Grans (Abs): 0.1 10*3/uL (ref 0.0–0.1)
Immature Granulocytes: 1 %
Lymphocytes Absolute: 3.5 10*3/uL — ABNORMAL HIGH (ref 0.7–3.1)
Lymphs: 39 %
MCH: 22 pg — ABNORMAL LOW (ref 26.6–33.0)
MCHC: 30.8 g/dL — ABNORMAL LOW (ref 31.5–35.7)
MCV: 71 fL — ABNORMAL LOW (ref 79–97)
Monocytes Absolute: 0.8 10*3/uL (ref 0.1–0.9)
Monocytes: 9 %
Neutrophils Absolute: 4.3 10*3/uL (ref 1.4–7.0)
Neutrophils: 47 %
Platelets: 249 10*3/uL (ref 150–450)
RBC: 5.28 x10E6/uL (ref 4.14–5.80)
RDW: 15.7 % — ABNORMAL HIGH (ref 11.6–15.4)
WBC: 9 10*3/uL (ref 3.4–10.8)

## 2021-02-14 LAB — CMP14+EGFR
ALT: 30 IU/L (ref 0–44)
AST: 33 IU/L (ref 0–40)
Albumin/Globulin Ratio: 1.2 (ref 1.2–2.2)
Albumin: 4.5 g/dL (ref 3.8–4.9)
Alkaline Phosphatase: 53 IU/L (ref 44–121)
BUN/Creatinine Ratio: 23 — ABNORMAL HIGH (ref 9–20)
BUN: 16 mg/dL (ref 6–24)
Bilirubin Total: 0.4 mg/dL (ref 0.0–1.2)
CO2: 21 mmol/L (ref 20–29)
Calcium: 9.5 mg/dL (ref 8.7–10.2)
Chloride: 101 mmol/L (ref 96–106)
Creatinine, Ser: 0.69 mg/dL — ABNORMAL LOW (ref 0.76–1.27)
Globulin, Total: 3.7 g/dL (ref 1.5–4.5)
Glucose: 99 mg/dL (ref 70–99)
Potassium: 4.3 mmol/L (ref 3.5–5.2)
Sodium: 139 mmol/L (ref 134–144)
Total Protein: 8.2 g/dL (ref 6.0–8.5)
eGFR: 108 mL/min/{1.73_m2} (ref >59)

## 2021-02-14 LAB — LIPID PANEL
Chol/HDL Ratio: 3.6 ratio (ref 0.0–5.0)
Cholesterol, Total: 147 mg/dL (ref 100–199)
HDL: 41 mg/dL (ref >39)
LDL Chol Calc (NIH): 94 mg/dL (ref 0–99)
Triglycerides: 57 mg/dL (ref 0–149)
VLDL Cholesterol Cal: 12 mg/dL (ref 5–40)

## 2021-02-14 LAB — VITAMIN D 25 HYDROXY (VIT D DEFICIENCY, FRACTURES): Vit D, 25-Hydroxy: 51.6 ng/mL (ref 30.0–100.0)

## 2021-02-14 MED ORDER — JANUMET XR 100-1000 MG PO TB24: 1.0000 | ORAL_TABLET | Freq: Every day | ORAL | 5 refills | Status: DC

## 2021-02-14 NOTE — Telephone Encounter (Signed)
Were pleased to let you know that weve approved your or your doctors request for coverage (sometimes called a prior authorization) for JARDIANCE TAB 25MG . You can now fill your prescription, and it will be covered according to your plan. As long as you remain covered by your prescription drug plan and there are no changes to your plan benefits, this request is approved from 02/13/2021 to 02/13/2022. When this approval expires, please speak to your doctor about your treatment.   Pharmacy aware.

## 2021-02-16 ENCOUNTER — Other Ambulatory Visit: Payer: Self-pay | Admitting: Family Medicine

## 2021-02-18 ENCOUNTER — Other Ambulatory Visit: Payer: Self-pay | Admitting: Family Medicine

## 2021-02-18 ENCOUNTER — Telehealth: Payer: Self-pay | Admitting: *Deleted

## 2021-02-18 DIAGNOSIS — E119 Type 2 diabetes mellitus without complications: Secondary | ICD-10-CM

## 2021-02-18 MED ORDER — QUINAPRIL HCL 40 MG PO TABS: ORAL_TABLET | ORAL | 3 refills | Status: DC

## 2021-02-18 NOTE — Telephone Encounter (Signed)
Janumet xr 100-1000mg  ER Tab  PA came in      Dr Livia Snellen:  Is pt supposed to be on Janumet XR [phone], Jardiance 25mg   and glipizide10 ?   We have done a couple PA rescently for him- just double checking..   Last OV 1/16 says KOMBIGLYZE XR 2.05-998 MG TB24; Take 1 tablet by mouth 2 (two) times daily. (needs to be seen)    Please clarify.

## 2021-02-18 NOTE — Telephone Encounter (Signed)
°  Janumet XR 100-1000MG  er tablets PA started Call in form - [phone] Called   Approved per phone  12 months - 02/18/2022.  Paid/covered- 90 days   PA # [id number]

## 2021-02-18 NOTE — Telephone Encounter (Signed)
For now, yes. I would like to wean him off of the glipizide as soon as possible.

## 2021-02-19 ENCOUNTER — Other Ambulatory Visit: Payer: Self-pay | Admitting: Family Medicine

## 2021-02-19 DIAGNOSIS — D509 Iron deficiency anemia, unspecified: Secondary | ICD-10-CM

## 2021-02-19 LAB — IRON AND TIBC
Iron Saturation: 10 % — ABNORMAL LOW (ref 15–55)
Iron: 47 ug/dL (ref 38–169)
Total Iron Binding Capacity: 457 ug/dL — ABNORMAL HIGH (ref 250–450)
UIBC: 410 ug/dL — ABNORMAL HIGH (ref 111–343)

## 2021-02-19 LAB — SPECIMEN STATUS REPORT

## 2021-02-19 LAB — FERRITIN: Ferritin: 12 ng/mL — ABNORMAL LOW (ref 30–400)

## 2021-02-21 ENCOUNTER — Ambulatory Visit: Payer: BLUE CROSS/BLUE SHIELD | Admitting: Family Medicine

## 2021-02-25 ENCOUNTER — Encounter (INDEPENDENT_AMBULATORY_CARE_PROVIDER_SITE_OTHER): Payer: Self-pay | Admitting: *Deleted

## 2021-03-13 ENCOUNTER — Other Ambulatory Visit: Payer: Self-pay | Admitting: Family Medicine

## 2021-03-20 ENCOUNTER — Telehealth: Payer: Self-pay | Admitting: Family Medicine

## 2021-03-21 ENCOUNTER — Other Ambulatory Visit: Payer: Self-pay | Admitting: Family Medicine

## 2021-03-21 MED ORDER — LANSOPRAZOLE 30 MG PO CPDR: 30.0000 mg | DELAYED_RELEASE_CAPSULE | Freq: Every day | ORAL | 3 refills | Status: DC

## 2021-03-21 NOTE — Telephone Encounter (Signed)
Insurance does not want to pay for pantoprazole. Patient must try the following    lansoprazole (PREVACID) 30 MG capsule  esomeprazole (NEXIUM) 20 MG capsule  omeprazole (PRILOSEC) 20 MG capsule  RABEprazole (ACIPHEX) 20 MG tablet

## 2021-03-21 NOTE — Telephone Encounter (Signed)
He should only take this once a day.

## 2021-03-21 NOTE — Telephone Encounter (Signed)
I sent in prevacid for him. Must be taken on an empty stomach. Wait for an hour after taking it before eating anything

## 2021-03-21 NOTE — Telephone Encounter (Signed)
Patient states we only sent 44 of Jannumet for month and he takes twice daily, advised his chart only says daily. Should he be taking once daily or twice?

## 2021-03-22 NOTE — Telephone Encounter (Signed)
Pt aware by phone 

## 2021-03-25 ENCOUNTER — Ambulatory Visit (INDEPENDENT_AMBULATORY_CARE_PROVIDER_SITE_OTHER): Payer: Self-pay | Admitting: Gastroenterology

## 2021-03-25 ENCOUNTER — Ambulatory Visit (INDEPENDENT_AMBULATORY_CARE_PROVIDER_SITE_OTHER): Payer: BLUE CROSS/BLUE SHIELD | Admitting: Gastroenterology

## 2021-03-25 ENCOUNTER — Other Ambulatory Visit (INDEPENDENT_AMBULATORY_CARE_PROVIDER_SITE_OTHER): Payer: Self-pay

## 2021-03-25 ENCOUNTER — Telehealth (INDEPENDENT_AMBULATORY_CARE_PROVIDER_SITE_OTHER): Payer: Self-pay

## 2021-03-25 ENCOUNTER — Encounter (INDEPENDENT_AMBULATORY_CARE_PROVIDER_SITE_OTHER): Payer: Self-pay

## 2021-03-25 ENCOUNTER — Encounter (INDEPENDENT_AMBULATORY_CARE_PROVIDER_SITE_OTHER): Payer: Self-pay | Admitting: Gastroenterology

## 2021-03-25 ENCOUNTER — Other Ambulatory Visit: Payer: Self-pay

## 2021-03-25 DIAGNOSIS — D509 Iron deficiency anemia, unspecified: Secondary | ICD-10-CM | POA: Insufficient documentation

## 2021-03-25 DIAGNOSIS — R109 Unspecified abdominal pain: Secondary | ICD-10-CM

## 2021-03-25 MED ORDER — PEG 3350-KCL-NA BICARB-NACL 420 G PO SOLR: 4000.0000 mL | ORAL | 0 refills | Status: DC

## 2021-03-25 NOTE — Telephone Encounter (Signed)
Garnell Phenix Ann Nekisha Mcdiarmid, CMA  ?

## 2021-03-25 NOTE — Patient Instructions (Signed)
Schedule EGD and colonoscopy

## 2021-03-25 NOTE — Progress Notes (Signed)
Kenneth Stone, M.D. Gastroenterology & Hepatology [facility] [address] [place], [place] [address] Primary Care Physician: Claretta Fraise, MD [place] [address]  Referring MD: PCP  Chief Complaint: Iron deficiency anemia and abdominal pain  History of Present Illness: Kenneth Stone is a 56 y.o. male with past medical history of diabetes, hypertension, GERD, hyperlipidemia, who presents for evaluation of iron deficiency anemia and abdominal pain.  Patient was referred to our clinic for evaluation of iron deficiency anemia.  Most recent CBC from 02/13/2021 showed a hemoglobin of 11.6 with MCV of 71, platelets of 249, white cell count of 9.0, iron of 47 normal, iron saturation was decreased 10% and ferritin was 12 decreased.  TIBC was 457.  He has been taking ferrous sulfate chronically once a day.  Patient reports that he has presented recurrent episodes of burning abdominal pain in the epigastric area.  Reports that he has been having this pain for "multiple years".  He was taking pantoprazole 40 mg every day which he believes helped alleviating his symptoms but states that he has been having recurrent symptoms recently and he became concerned about this. Has also been presenting pain in the sides of his hips bilaterally, for which he takes vinegar which she believes helps improve his symptoms. The patient denies having any nausea, vomiting, fever, chills, hematochezia, melena, hematemesis, abdominal distention, abdominal pain, diarrhea, jaundice, pruritus or weight loss.  Does not follow a strict diet.  Last XTG:GYIRS Last Colonoscopy:2018 The perianal and digital rectal examinations were normal. The colon (entire examined portion) appeared normal. External hemorrhoids were found during retroflexion. The hemorrhoids were small. A 10 mm polypoid lesion was found at the anus. No bleeding was present.  FHx: neg for any  gastrointestinal/liver disease, no malignancies Social: neg smoking, frequent alcohol or illicit drug use Surgical: no abdominal surgeries  Past Medical History: Past Medical History:  Diagnosis Date   Diabetes mellitus without complication ([place])    Essential hypertension 09/05/2014   GERD (gastroesophageal reflux disease)    Hyperlipidemia 12/31/2014   Spondylosis, cervical, with myelopathy 02/06/2016    Past Surgical History: Past Surgical History:  Procedure Laterality Date   COLONOSCOPY N/A 01/23/2017   Procedure: COLONOSCOPY;  Surgeon: Rogene Houston, MD;  Location: [facility];  Service: Endoscopy;  Laterality: N/A;  1:00    Family History: Family History  Problem Relation Age of Onset   Cancer Mother    Heart disease Father     Social History: Social History   Tobacco Use  Smoking Status Never  Smokeless Tobacco Never   Social History   Substance and Sexual Activity  Alcohol Use Yes   Alcohol/week: 2.0 standard drinks   Types: 2 Standard drinks or equivalent per week   Comment: occasionally   Social History   Substance and Sexual Activity  Drug Use No    Allergies: No Known Allergies  Medications: Current Outpatient Medications  Medication Sig Dispense Refill   atorvastatin (LIPITOR) 20 MG tablet Take 1 tablet (20 mg total) by mouth daily. 90 tablet 3   Blood Glucose Monitoring Suppl (ONETOUCH VERIO) w/Device KIT Test BS BID Dx E11.9 1 kit 0   empagliflozin (JARDIANCE) 25 MG TABS tablet Take 1 tablet (25 mg total) by mouth daily. 90 tablet 3   glipiZIDE (GLUCOTROL) 10 MG tablet Take 1 tablet (10 mg total) by mouth daily before breakfast. 90 tablet 3   ONETOUCH VERIO test strip TEST BLOOD  SUGAR TWICE DAILY E11.9 50 strip 11   pantoprazole (PROTONIX) 40 MG tablet Take 40 mg by mouth daily.     quinapril (ACCUPRIL) 40 MG tablet TAKE 1 TABLET BY MOUTH EVERYDAY AT BEDTIME 90 tablet 3   SitaGLIPtin-MetFORMIN HCl (JANUMET XR) [phone] MG TB24 Take 1  tablet by mouth daily. For diabetes (Patient taking differently: Take 1 tablet by mouth in the morning and at bedtime. For diabetes) 30 tablet 5   ferrous sulfate 325 (65 FE) MG tablet Take 1 tablet (325 mg total) by mouth daily with breakfast. (Patient not taking: Reported on 03/25/2021) 60 tablet 2   Wheat Dextrin (BENEFIBER DRINK MIX) PACK Take 4 g by mouth at bedtime. (Patient not taking: Reported on 03/25/2021)     No current facility-administered medications for this visit.    Review of Systems: GENERAL: negative for malaise, night sweats HEENT: No changes in hearing or vision, no nose bleeds or other nasal problems. NECK: Negative for lumps, goiter, pain and significant neck swelling RESPIRATORY: Negative for cough, wheezing CARDIOVASCULAR: Negative for chest pain, leg swelling, palpitations, orthopnea GI: SEE HPI MUSCULOSKELETAL: Negative for joint pain or swelling, back pain, and muscle pain. SKIN: Negative for lesions, rash PSYCH: Negative for sleep disturbance, mood disorder and recent psychosocial stressors. HEMATOLOGY Negative for prolonged bleeding, bruising easily, and swollen nodes. ENDOCRINE: Negative for cold or heat intolerance, polyuria, polydipsia and goiter. NEURO: negative for tremor, gait imbalance, syncope and seizures. The remainder of the review of systems is noncontributory.   Physical Exam: BP 128/80 (BP Location: Left Arm, Patient Position: Sitting, Cuff Size: Large)    Pulse 79    Temp 98.6 F (37 C) (Oral)    Ht _0  (1.88 m)    Wt 223 lb (101.2 kg)    BMI 28.63 kg/m  GENERAL: The patient is AO x3, in no acute distress. HEENT: Head is normocephalic and atraumatic. EOMI are intact. Mouth is well hydrated and without lesions. NECK: Supple. No masses LUNGS: Clear to auscultation. No presence of rhonchi/wheezing/rales. Adequate chest expansion HEART: RRR, normal s1 and s2. ABDOMEN: Soft, tender palpation in the left and right flank, no guarding, no  peritoneal signs, and nondistended. BS +. No masses. EXTREMITIES: Without any cyanosis, clubbing, rash, lesions or edema. NEUROLOGIC: AOx3, no focal motor deficit. SKIN: no jaundice, no rashes   Imaging/Labs: as above  I personally reviewed and interpreted the available labs, imaging and endoscopic files.  Impression and Plan: Kenneth Stone is a 56 y.o. male with past medical history of diabetes, hypertension, GERD, hyperlipidemia, who presents for evaluation of iron deficiency anemia and abdominal pain.  The patient has presented recurrent episodes of abdominal pain of unclear etiology for multiple years that initially responded to PPI therapy but more recently he has been refractory to the medication.  Of concern, he has presented mild iron deficiency anemia for which he is currently taking oral iron.  At this point we will evaluate his gastrointestinal tract with an EGD and a colonoscopy to evaluate for sources of his pain and gastrointestinal losses explaining his anemia.  He understood and agreed.  He can continue taking Protonix 40 mg every day for now.  If this investigations are unremarkable, we will proceed with CT of the abdomen and pelvis with IV contrast and possibly a capsule endoscopy.  - Schedule EGD and colonoscopy -Continue Protonix 40 mg every day. - Will consider CT abdomen and pelvis depending on endoscopic findings.  All questions were answered.  Kenneth Peppers, MD Gastroenterology and Hepatology [facility]

## 2021-04-09 ENCOUNTER — Other Ambulatory Visit: Payer: Self-pay | Admitting: Family Medicine

## 2021-04-09 ENCOUNTER — Telehealth: Payer: Self-pay | Admitting: Family Medicine

## 2021-04-09 DIAGNOSIS — E119 Type 2 diabetes mellitus without complications: Secondary | ICD-10-CM

## 2021-04-09 MED ORDER — JANUMET 50-1000 MG PO TABS: 1.0000 | ORAL_TABLET | Freq: Two times a day (BID) | ORAL | 5 refills | Status: DC

## 2021-04-09 MED ORDER — RABEPRAZOLE SODIUM 20 MG PO TBEC: 20.0000 mg | DELAYED_RELEASE_TABLET | Freq: Every day | ORAL | 3 refills | Status: DC

## 2021-04-09 NOTE — Telephone Encounter (Signed)
Try prior authorization ?

## 2021-04-09 NOTE — Telephone Encounter (Signed)
The computer says that aciphex is preferred. I sent that in for him. ?

## 2021-04-09 NOTE — Telephone Encounter (Signed)
Scrip sent 

## 2021-04-09 NOTE — Telephone Encounter (Signed)
Pt says that he wants to go back to taking his metformin Rx twice daily instead of once daily.  ? ?Needs Dr Livia Snellen to send in new Rx to pharmacy for this.  ?

## 2021-04-09 NOTE — Telephone Encounter (Signed)
Pt states that his insurance does not cover Protonix and needs Dr Livia Snellen to send something else in that his insurance will cover. ?

## 2021-04-10 NOTE — Telephone Encounter (Signed)
Janumet 50-'1000MG'$  tablets PA started ?Key: [id number] ? ? ?Sent to Plan today ?

## 2021-04-17 ENCOUNTER — Ambulatory Visit (HOSPITAL_COMMUNITY): Payer: 59 | Admitting: Anesthesiology

## 2021-04-17 ENCOUNTER — Encounter (HOSPITAL_COMMUNITY): Payer: Self-pay | Admitting: Gastroenterology

## 2021-04-17 ENCOUNTER — Encounter (INDEPENDENT_AMBULATORY_CARE_PROVIDER_SITE_OTHER): Payer: Self-pay | Admitting: *Deleted

## 2021-04-17 ENCOUNTER — Ambulatory Visit (HOSPITAL_COMMUNITY)
Admission: RE | Admit: 2021-04-17 | Discharge: 2021-04-17 | Disposition: A | Discharge status: Home / Self Care | Payer: 59 | Attending: Gastroenterology | Admitting: Gastroenterology

## 2021-04-17 ENCOUNTER — Ambulatory Visit (HOSPITAL_BASED_OUTPATIENT_CLINIC_OR_DEPARTMENT_OTHER): Payer: 59 | Admitting: Anesthesiology

## 2021-04-17 ENCOUNTER — Encounter (HOSPITAL_COMMUNITY)
Admission: RE | Disposition: A | Discharge status: Home / Self Care | Payer: Self-pay | Source: Home / Self Care | Attending: Gastroenterology

## 2021-04-17 ENCOUNTER — Other Ambulatory Visit: Payer: Self-pay

## 2021-04-17 DIAGNOSIS — Z7984 Long term (current) use of oral hypoglycemic drugs: Secondary | ICD-10-CM | POA: Diagnosis not present

## 2021-04-17 DIAGNOSIS — D181 Lymphangioma, any site: Secondary | ICD-10-CM | POA: Insufficient documentation

## 2021-04-17 DIAGNOSIS — D509 Iron deficiency anemia, unspecified: Secondary | ICD-10-CM

## 2021-04-17 DIAGNOSIS — L859 Epidermal thickening, unspecified: Secondary | ICD-10-CM | POA: Diagnosis not present

## 2021-04-17 DIAGNOSIS — K2970 Gastritis, unspecified, without bleeding: Secondary | ICD-10-CM | POA: Insufficient documentation

## 2021-04-17 DIAGNOSIS — M1990 Unspecified osteoarthritis, unspecified site: Secondary | ICD-10-CM | POA: Insufficient documentation

## 2021-04-17 DIAGNOSIS — K2980 Duodenitis without bleeding: Secondary | ICD-10-CM | POA: Diagnosis not present

## 2021-04-17 DIAGNOSIS — I10 Essential (primary) hypertension: Secondary | ICD-10-CM | POA: Insufficient documentation

## 2021-04-17 DIAGNOSIS — E119 Type 2 diabetes mellitus without complications: Secondary | ICD-10-CM | POA: Diagnosis not present

## 2021-04-17 DIAGNOSIS — R1013 Epigastric pain: Secondary | ICD-10-CM | POA: Diagnosis not present

## 2021-04-17 DIAGNOSIS — K648 Other hemorrhoids: Secondary | ICD-10-CM | POA: Diagnosis not present

## 2021-04-17 DIAGNOSIS — K319 Disease of stomach and duodenum, unspecified: Secondary | ICD-10-CM | POA: Insufficient documentation

## 2021-04-17 DIAGNOSIS — K2990 Gastroduodenitis, unspecified, without bleeding: Secondary | ICD-10-CM

## 2021-04-17 DIAGNOSIS — K219 Gastro-esophageal reflux disease without esophagitis: Secondary | ICD-10-CM | POA: Insufficient documentation

## 2021-04-17 DIAGNOSIS — K6389 Other specified diseases of intestine: Secondary | ICD-10-CM | POA: Diagnosis not present

## 2021-04-17 DIAGNOSIS — D490 Neoplasm of unspecified behavior of digestive system: Secondary | ICD-10-CM

## 2021-04-17 DIAGNOSIS — Z79899 Other long term (current) drug therapy: Secondary | ICD-10-CM | POA: Insufficient documentation

## 2021-04-17 DIAGNOSIS — R109 Unspecified abdominal pain: Secondary | ICD-10-CM | POA: Diagnosis not present

## 2021-04-17 HISTORY — PX: ESOPHAGOGASTRODUODENOSCOPY (EGD) WITH PROPOFOL: SHX5813

## 2021-04-17 HISTORY — PX: BIOPSY: SHX5522

## 2021-04-17 HISTORY — PX: COLONOSCOPY WITH PROPOFOL: SHX5780

## 2021-04-17 LAB — HM COLONOSCOPY

## 2021-04-17 LAB — GLUCOSE, CAPILLARY: Glucose-Capillary: 119 mg/dL — ABNORMAL HIGH (ref 70–99)

## 2021-04-17 SURGERY — COLONOSCOPY WITH PROPOFOL
Anesthesia: General

## 2021-04-17 MED ORDER — LACTATED RINGERS IV SOLN: INTRAVENOUS | Status: DC

## 2021-04-17 MED ORDER — PROPOFOL 500 MG/50ML IV EMUL
INTRAVENOUS | Status: DC | PRN
  Administered 2021-04-17: 200 ug/kg/min via INTRAVENOUS

## 2021-04-17 MED ORDER — LIDOCAINE 2% (20 MG/ML) 5 ML SYRINGE
INTRAMUSCULAR | Status: DC | PRN
  Administered 2021-04-17: 50 mg via INTRAVENOUS

## 2021-04-17 MED ORDER — PHENYLEPHRINE 40 MCG/ML (10ML) SYRINGE FOR IV PUSH (FOR BLOOD PRESSURE SUPPORT)
PREFILLED_SYRINGE | INTRAVENOUS | Status: DC | PRN
  Administered 2021-04-17: 80 ug via INTRAVENOUS

## 2021-04-17 MED ORDER — PROPOFOL 10 MG/ML IV BOLUS
INTRAVENOUS | Status: DC | PRN
  Administered 2021-04-17: 30 mg via INTRAVENOUS
  Administered 2021-04-17: 120 mg via INTRAVENOUS

## 2021-04-17 NOTE — Transfer of Care (Signed)
Immediate Anesthesia Transfer of Care Note ? ?Patient: Kenneth Stone ? ?Procedure(s) Performed: COLONOSCOPY WITH PROPOFOL ?ESOPHAGOGASTRODUODENOSCOPY (EGD) WITH PROPOFOL ?BIOPSY ? ?Patient Location: Endoscopy Unit ? ?Anesthesia Type:MAC ? ?Level of Consciousness: sedated and responds to stimulation ? ?Airway & Oxygen Therapy: Patient Spontanous Breathing ? ?Post-op Assessment: Report given to RN and Post -op Vital signs reviewed and stable ? ?Post vital signs: Reviewed and stable ? ?Last Vitals:  ?Vitals Value Taken Time  ?BP    ?Temp    ?Pulse    ?Resp    ?SpO2    ? ? ?Last Pain:  ?Vitals:  ? 04/17/21 1215  ?TempSrc: Oral  ?PainSc:   ?   ? ?Patients Stated Pain Goal: 6 (04/17/21 1203) ? ?Complications: No notable events documented. ?

## 2021-04-17 NOTE — Anesthesia Preprocedure Evaluation (Addendum)
Anesthesia Evaluation  ?Patient identified by MRN, date of birth, ID band ?Patient awake ? ? ? ?Reviewed: ?Allergy & Precautions, NPO status , Patient's Chart, lab work & pertinent test results ? ?Airway ? ? ? ? ? ? ? Dental ? ?(+) Dental Advisory Given ?  ?Pulmonary ?neg pulmonary ROS,  ?  ?Pulmonary exam normal ?breath sounds clear to auscultation ? ? ? ? ? ? Cardiovascular ?hypertension, Pt. on medications ?Normal cardiovascular exam ?Rhythm:Regular Rate:Normal ? ? ?  ?Neuro/Psych ?negative neurological ROS ? negative psych ROS  ? GI/Hepatic ?Neg liver ROS, GERD  ,  ?Endo/Other  ?diabetes, Well Controlled, Type 2, Oral Hypoglycemic Agents ? Renal/GU ?negative Renal ROS  ?negative genitourinary ?  ?Musculoskeletal ? ?(+) Arthritis , Osteoarthritis,   ? Abdominal ?  ?Peds ?negative pediatric ROS ?(+)  Hematology ? ?(+) Blood dyscrasia, anemia ,   ?Anesthesia Other Findings ? ? Reproductive/Obstetrics ?negative OB ROS ? ?  ? ? ? ? ? ? ? ? ? ? ? ? ? ?  ?  ? ? ? ? ? ? ? ?Anesthesia Physical ?Anesthesia Plan ? ?ASA: 2 ? ?Anesthesia Plan: General  ? ?Post-op Pain Management: Minimal or no pain anticipated  ? ?Induction: Intravenous ? ?PONV Risk Score and Plan: TIVA ? ?Airway Management Planned: Nasal Cannula and Natural Airway ? ?Additional Equipment:  ? ?Intra-op Plan:  ? ?Post-operative Plan:  ? ?Informed Consent: I have reviewed the patients History and Physical, chart, labs and discussed the procedure including the risks, benefits and alternatives for the proposed anesthesia with the patient or authorized representative who has indicated his/her understanding and acceptance.  ? ? ? ?Dental advisory given ? ?Plan Discussed with: CRNA and Surgeon ? ?Anesthesia Plan Comments:   ? ? ? ? ? ? ?Anesthesia Quick Evaluation ? ?

## 2021-04-17 NOTE — Discharge Instructions (Addendum)
You are being discharged to home.  Resume your previous diet.  We are waiting for your pathology results.  Your physician has recommended a repeat colonoscopy in 10 years for screening purposes.  

## 2021-04-17 NOTE — Anesthesia Postprocedure Evaluation (Signed)
Anesthesia Post Note ? ?Patient: Kenneth Stone ? ?Procedure(s) Performed: COLONOSCOPY WITH PROPOFOL ?ESOPHAGOGASTRODUODENOSCOPY (EGD) WITH PROPOFOL ?BIOPSY ? ?Patient location during evaluation: Phase II ?Anesthesia Type: General ?Level of consciousness: awake and alert and oriented ?Pain management: pain level controlled ?Vital Signs Assessment: post-procedure vital signs reviewed and stable ?Respiratory status: spontaneous breathing, nonlabored ventilation and respiratory function stable ?Cardiovascular status: blood pressure returned to baseline and stable ?Postop Assessment: no apparent nausea or vomiting ?Anesthetic complications: no ? ? ?No notable events documented. ? ? ?Last Vitals:  ?Vitals:  ? 04/17/21 1325 04/17/21 1327  ?BP: 98/66 104/67  ?Resp: 20 12  ?Temp: 36.4 ?C   ?SpO2: 99% 100%  ?  ?Last Pain:  ?Vitals:  ? 04/17/21 1325  ?TempSrc: Axillary  ?PainSc:   ? ? ?  ?  ?  ?  ?  ?  ? ?[name] ? ? ? ? ?

## 2021-04-17 NOTE — Interval H&P Note (Signed)
History and Physical Interval Note: ? ?04/17/2021 ?12:28 PM ? ?Kenneth Stone  has presented today for surgery, with the diagnosis of Iron deficiency anemia abdominal pain.  The various methods of treatment have been discussed with the patient and family. After consideration of risks, benefits and other options for treatment, the patient has consented to  Procedure(s) with comments: ?COLONOSCOPY WITH PROPOFOL (N/A) - 200 ?ESOPHAGOGASTRODUODENOSCOPY (EGD) WITH PROPOFOL (N/A) as a surgical intervention.  The patient's history has been reviewed, patient examined, no change in status, stable for surgery.  I have reviewed the patient's chart and labs.  Questions were answered to the patient's satisfaction.   ? ? ?[name] ? ? ?

## 2021-04-17 NOTE — Op Note (Signed)
[facility] ?Patient Name: Kenneth Stone ?Procedure Date: 04/17/2021 12:32 PM ?MRN: [id number] ?Date of Birth: 1965-12-09 ?Attending MD: Maylon Peppers ,  ?CSN: [id number] ?Age: 56 ?Admit Type: Outpatient ?Procedure:                Upper GI endoscopy ?Indications:              Epigastric abdominal pain, Iron deficiency anemia ?Providers:                Maylon Peppers, Janeece Riggers, RN, Randa Spike,  ?                          Technician ?Referring MD:              ?Medicines:                Monitored Anesthesia Care ?Complications:            No immediate complications. ?Estimated Blood Loss:     Estimated blood loss: none. ?Procedure:                Pre-Anesthesia Assessment: ?                          - Prior to the procedure, a History and Physical  ?                          was performed, and patient medications, allergies  ?                          and sensitivities were reviewed. The patient's  ?                          tolerance of previous anesthesia was reviewed. ?                          - The risks and benefits of the procedure and the  ?                          sedation options and risks were discussed with the  ?                          patient. All questions were answered and informed  ?                          consent was obtained. ?                          - ASA Grade Assessment: II - A patient with mild  ?                          systemic disease. ?                          After obtaining informed consent, the endoscope was  ?                          passed under direct vision. Throughout the  ?  procedure, the patient's blood pressure, pulse, and  ?                          oxygen saturations were monitored continuously. The  ?                          [id number]-[id number] ([id number]) scope was introduced through the  ?                          mouth, and advanced to the second part of duodenum.  ?                          The upper GI endoscopy was accomplished  without  ?                          difficulty. The patient tolerated the procedure  ?                          well. ?Scope In: 12:43:04 PM ?Scope Out: 12:50:14 PM ?Total Procedure Duration: 0 hours 7 minutes 10 seconds  ?Findings: ?     The Z-line was irregular and was found 37 cm from the incisors. ?     The examined esophagus was normal. ?     Segmental mild inflammation characterized by erythema was found in the  ?     gastric antrum. Biopsies were taken with a cold forceps for Helicobacter  ?     pylori testing. ?     Localized mild inflammation characterized by erythema was found in the  ?     duodenal bulb. Biopsies from the second portion were taken with a cold  ?     forceps for histology. ?Impression:               - Z-line regular, 37 cm from the incisors. ?                          - Normal esophagus. ?                          - Gastritis. Biopsied. ?                          - Duodenitis. Biopsied. ?Moderate Sedation: ?     Per Anesthesia Care ?Recommendation:           - Discharge patient to home (ambulatory). ?                          - Resume previous diet. ?                          - Await pathology results. ?Procedure Code(s):        --- Professional --- ?                          [phone], Esophagogastroduodenoscopy, flexible,  ?                          transoral;  with biopsy, single or multiple ?Diagnosis Code(s):        --- Professional --- ?                          K29.70, Gastritis, unspecified, without bleeding ?                          K29.80, Duodenitis without bleeding ?                          R10.13, Epigastric pain ?                          D50.9, Iron deficiency anemia, unspecified ?CPT copyright 2019 American Medical Association. All rights reserved. ?The codes documented in this report are preliminary and upon coder review may  ?be revised to meet current compliance requirements. ?Maylon Peppers, MD ?Maylon Peppers,  ?04/17/2021 1:21:16 PM ?This report has been signed  electronically. ?Number of Addenda: 0 ?

## 2021-04-17 NOTE — Op Note (Signed)
[facility] ?Patient Name: Kenneth Stone ?Procedure Date: 04/17/2021 12:31 PM ?MRN: [id number] ?Date of Birth: 07/19/1965 ?Attending MD: Maylon Peppers ,  ?CSN: [id number] ?Age: 55 ?Admit Type: Outpatient ?Procedure:                Colonoscopy ?Indications:              Iron deficiency anemia ?Providers:                Maylon Peppers, Janeece Riggers, RN, Randa Spike,  ?                          Technician ?Referring MD:              ?Medicines:                Monitored Anesthesia Care ?Complications:            No immediate complications. ?Estimated Blood Loss:     Estimated blood loss: none. ?Procedure:                Pre-Anesthesia Assessment: ?                          - Prior to the procedure, a History and Physical  ?                          was performed, and patient medications, allergies  ?                          and sensitivities were reviewed. The patient's  ?                          tolerance of previous anesthesia was reviewed. ?                          - The risks and benefits of the procedure and the  ?                          sedation options and risks were discussed with the  ?                          patient. All questions were answered and informed  ?                          consent was obtained. ?                          - ASA Grade Assessment: II - A patient with mild  ?                          systemic disease. ?                          After obtaining informed consent, the colonoscope  ?                          was passed under direct vision. Throughout the  ?  procedure, the patient's blood pressure, pulse, and  ?                          oxygen saturations were monitored continuously. The  ?                          PCF-HQ190L ([id number]) was introduced through the  ?                          anus and advanced to the the terminal ileum. The  ?                          colonoscopy was performed without difficulty. The  ?                          patient  tolerated the procedure well. The quality  ?                          of the bowel preparation was excellent. ?Scope In: 12:55:16 PM ?Scope Out: 1:20:23 PM ?Scope Withdrawal Time: 0 hours 19 minutes 54 seconds  ?Total Procedure Duration: 0 hours 25 minutes 7 seconds  ?Findings: ?     The perianal and digital rectal examinations were normal. ?     The terminal ileum appeared normal. ?     The colon (entire examined portion) appeared normal. ?     A 10 mm polypoid lesion was found in the rectum when retroflexion was  ?     performed. This polypoid lesion was lovated just distal to the dentate  ?     line. The lesion was pedunculated. Size was similar in 2018. Likely  ?     corresponds to hypertrophic anal papillae. No bleeding was present. This  ?     was biopsied with a cold forceps for histology. ?     Non-bleeding internal hemorrhoids were found during retroflexion. The  ?     hemorrhoids were small. ?Impression:               - The examined portion of the ileum was normal. ?                          - The entire examined colon is normal. ?                          - Likely benign polypoid lesion in the rectum  ?                          (hypertrophic anal papillae). Biopsied. ?                          - Non-bleeding internal hemorrhoids. ?Moderate Sedation: ?     Per Anesthesia Care ?Recommendation:           - Discharge patient to home (ambulatory). ?                          - Resume previous diet. ?                          -  Await pathology results. ?                          - Repeat colonoscopy in 10 years for screening  ?                          purposes. ?                          - May consider capsule endoscopy, depending on  ?                          results of pathology. ?Procedure Code(s):        --- Professional --- ?                          [phone], Colonoscopy, flexible; with biopsy, single  ?                          or multiple ?Diagnosis Code(s):        --- Professional --- ?                           C58.5, Other hemorrhoids ?                          D49.0, Neoplasm of unspecified behavior of  ?                          digestive system ?                          D50.9, Iron deficiency anemia, unspecified ?CPT copyright 2019 American Medical Association. All rights reserved. ?The codes documented in this report are preliminary and upon coder review may  ?be revised to meet current compliance requirements. ?Maylon Peppers, MD ?Maylon Peppers,  ?04/17/2021 1:29:49 PM ?This report has been signed electronically. ?Number of Addenda: 0 ?

## 2021-04-18 LAB — SURGICAL PATHOLOGY

## 2021-04-19 LAB — H. PYLORI ANTIBODY, IGG: H Pylori IgG: 0.47 Index Value (ref 0.00–0.79)

## 2021-04-22 ENCOUNTER — Encounter (INDEPENDENT_AMBULATORY_CARE_PROVIDER_SITE_OTHER): Payer: Self-pay

## 2021-04-22 ENCOUNTER — Encounter (HOSPITAL_COMMUNITY): Payer: Self-pay | Admitting: Gastroenterology

## 2021-05-06 ENCOUNTER — Telehealth: Payer: Self-pay | Admitting: *Deleted

## 2021-05-06 NOTE — Telephone Encounter (Signed)
Key: [place] - PA Case ID: [id number] - Rx #: [id number] ? ?Drug ? ?Janumet 50-'1000MG'$  tablets  ?Form ? ?[employer] Electronic PA Form [phone] NCPDP) ? ?Original Claim Info ? ?[phone] MUST MEET STEP, [place] REQR [phone] THERAPY IS REQUIREDDRUG REQUIRES PRIOR AUTHORIZATION(PHARMACY HELP DESK [phone])  ? ?Sent to plan today ?

## 2021-05-07 NOTE — Telephone Encounter (Signed)
Dear [name]: ?We?re pleased to let you know that we?ve approved your or your doctor?s request for coverage ?for JANUMET TAB 50-'1000MG'$ . You can now fill your prescription, and it will be covered ?according to your plan. ?As long as you remain covered by your prescription drug plan and there are no changes to your ?plan benefits, this request is approved from 05/07/2021 to 05/08/2022. When this approval ?expires, please speak to your doctor about your treatment. ? ? ?

## 2021-05-07 NOTE — Telephone Encounter (Signed)
PA restarted on 04/10- Please see 04/10 telephone note. ?

## 2021-05-13 ENCOUNTER — Encounter: Payer: Self-pay | Admitting: Family Medicine

## 2021-05-13 ENCOUNTER — Ambulatory Visit (INDEPENDENT_AMBULATORY_CARE_PROVIDER_SITE_OTHER): Payer: 59 | Admitting: Family Medicine

## 2021-05-13 VITALS — BP 125/78 | HR 72 | Temp 97.7°F | Ht 72.0 in | Wt 221.4 lb

## 2021-05-13 DIAGNOSIS — E785 Hyperlipidemia, unspecified: Secondary | ICD-10-CM

## 2021-05-13 DIAGNOSIS — I10 Essential (primary) hypertension: Secondary | ICD-10-CM

## 2021-05-13 DIAGNOSIS — E119 Type 2 diabetes mellitus without complications: Secondary | ICD-10-CM

## 2021-05-13 DIAGNOSIS — D509 Iron deficiency anemia, unspecified: Secondary | ICD-10-CM

## 2021-05-13 DIAGNOSIS — R5383 Other fatigue: Secondary | ICD-10-CM | POA: Diagnosis not present

## 2021-05-13 LAB — BAYER DCA HB A1C WAIVED: HB A1C (BAYER DCA - WAIVED): 7.2 % — ABNORMAL HIGH (ref 4.8–5.6)

## 2021-05-13 MED ORDER — JANUMET 50-1000 MG PO TABS: 1.0000 | ORAL_TABLET | Freq: Two times a day (BID) | ORAL | 3 refills | Status: DC

## 2021-05-13 MED ORDER — PANTOPRAZOLE SODIUM 40 MG PO TBEC: 40.0000 mg | DELAYED_RELEASE_TABLET | Freq: Two times a day (BID) | ORAL | 3 refills | Status: DC

## 2021-05-13 NOTE — Progress Notes (Signed)
? ?Subjective:  ?Patient ID: Kenneth Stone,  ?male    DOB: 1965/12/19  Age: 56 y.o.  ? ? ?CC: Medical Management of Chronic Issues ? ? ?HPI ?Kenneth Stone presents for  follow-up of hypertension. Patient has no history of headache chest pain or shortness of breath or recent cough. Patient also denies symptoms of TIA such as numbness weakness lateralizing. Patient denies side effects from medication. States taking it regularly. ? ?Patient also  in for follow-up of elevated cholesterol. Doing well without complaints on current medication. Denies side effects  including myalgia and arthralgia and nausea. Also in today for liver function testing. Currently no chest pain, shortness of breath or other cardiovascular related symptoms noted. ? ?Follow-up of diabetes. Patient does check blood sugar at home. Readings run between 200 and 300. Interfering with sleep.  ?Patient denies symptoms such as excessive hunger or urinary frequency, excessive hunger, nausea ?No significant hypoglycemic spells noted. ?Medications reviewed. Pt reports taking them regularly. Pt. denies complication/adverse reaction today.  ? ?Recent colonoscopy was clean. Also EGD was done and showed Gastritis and duodenitis. Pt. Continues to have abdominal discomfort.  ? ? ? 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  ? ?Kenneth Stone has a past surgical history that includes Colonoscopy (N/A, 01/23/2017); Colonoscopy with propofol (N/A, 04/17/2021); Esophagogastroduodenoscopy (egd) with propofol (N/A, 04/17/2021); and biopsy (04/17/2021).  ? ?His family history includes Cancer in his mother; Heart disease in his father.Kenneth Stone reports that Kenneth Stone has never smoked. Kenneth Stone has never used smokeless tobacco. Kenneth Stone reports current alcohol use of about 2.0 standard drinks per week. Kenneth Stone  reports that Kenneth Stone does not use drugs. ? ?Current Outpatient Medications on File Prior to Visit  ?Medication Sig Dispense Refill  ? atorvastatin (LIPITOR) 20 MG tablet Take 1 tablet (20 mg total) by mouth daily. 90 tablet 3  ? Blood Glucose Monitoring Suppl (ONETOUCH VERIO) w/Device KIT Test BS BID Dx E11.9 1 kit 0  ? empagliflozin (JARDIANCE) 25 MG TABS tablet Take 1 tablet (25 mg total) by mouth daily. 90 tablet 3  ? glipiZIDE (GLUCOTROL) 10 MG tablet Take 1 tablet (10 mg total) by mouth daily before breakfast. 90 tablet 3  ? ONETOUCH VERIO test strip TEST BLOOD SUGAR TWICE DAILY E11.9 50 strip 11  ? quinapril (ACCUPRIL) 40 MG tablet TAKE 1 TABLET BY MOUTH EVERYDAY AT BEDTIME (Patient taking differently: Take 40 mg by mouth at bedtime.) 90 tablet 3  ? ?No current facility-administered medications on file prior to visit.  ? ? ?ROS ?Review of Systems  ?Constitutional:  Positive for fatigue (poor energy the first two hours of the day. Getting only 6 hours of sleep a night. Up by 5 AM daily.). Negative for fever.  ?Respiratory:  Negative for shortness of breath.   ?Cardiovascular:  Negative for chest pain.  ?Musculoskeletal:  Negative for arthralgias.  ?Skin:  Negative for rash.  ? ?Objective:  ?BP 125/78   Pulse 72   Temp 97.7 ?F (36.5 ?C)   Ht 6' (1.829 m)   Wt 221 lb 6.4 oz (100.4 kg)   SpO2 98%   BMI 30.03 kg/m?  ? ?BP Readings from Last 3 Encounters:  ?05/13/21 125/78  ?04/17/21 104/67  ?03/25/21 128/80  ? ? ?Wt Readings from Last 3 Encounters:  ?05/13/21 221 lb 6.4 oz (100.4 kg)  ?04/17/21 223 lb (101.2 kg)  ?03/25/21 223 lb (101.2 kg)  ? ? ? ?  Physical Exam ?Vitals reviewed.  ?Constitutional:   ?   Appearance: Kenneth Stone is well-developed.  ?HENT:  ?   Head: Normocephalic and atraumatic.  ?   Right Ear: External ear normal.  ?   Left Ear: External ear normal.  ?   Mouth/Throat:  ?   Pharynx: No oropharyngeal exudate or posterior oropharyngeal erythema.  ?Eyes:  ?   Pupils: Pupils are equal, round, and reactive to  light.  ?Cardiovascular:  ?   Rate and Rhythm: Normal rate and regular rhythm.  ?   Heart sounds: No murmur heard. ?Pulmonary:  ?   Effort: No respiratory distress.  ?   Breath sounds: Normal breath sounds.  ?Musculoskeletal:  ?   Cervical back: Normal range of motion and neck supple.  ?Neurological:  ?   Mental Status: Kenneth Stone is alert and oriented to person, place, and time.  ? ? ?Diabetic Foot Exam - Simple   ?No data filed ?  ? ? ? ? ?Assessment & Plan:  ? ?Kenneth Stone was seen today for medical management of chronic issues. ? ?Diagnoses and all orders for this visit: ? ?Controlled type 2 diabetes mellitus without complication, without long-term current use of insulin ([place]) ?-     Bayer DCA Hb A1c Waived ?-     Testosterone,Free and Total; Future ?-     TSH ?-     Ambulatory referral to Ophthalmology ? ?Essential hypertension ?-     CBC with Differential/Platelet ?-     CMP14+EGFR ? ?Hyperlipidemia, unspecified hyperlipidemia type ?-     Lipid panel ? ?Iron deficiency anemia, unspecified iron deficiency anemia type ?-     CBC with Differential/Platelet ?-     Iron, TIBC and Ferritin Panel ? ?Fatigue, unspecified type ?-     Testosterone,Free and Total; Future ?-     TSH ? ?Other orders ?-     sitaGLIPtin-metformin (JANUMET) 50-1000 MG tablet; Take 1 tablet by mouth 2 (two) times daily with a meal. ?-     pantoprazole (PROTONIX) 40 MG tablet; Take 1 tablet (40 mg total) by mouth 2 (two) times daily. ? ? ?I have changed Kenneth Stone's pantoprazole. I am also having him maintain his atorvastatin, empagliflozin, glipiZIDE, OneTouch Verio, quinapril, OneTouch Verio, and Janumet. ? ?Meds ordered this encounter  ?Medications  ? sitaGLIPtin-metformin (JANUMET) 50-1000 MG tablet  ?  Sig: Take 1 tablet by mouth 2 (two) times daily with a meal.  ?  Dispense:  180 tablet  ?  Refill:  3  ? pantoprazole (PROTONIX) 40 MG tablet  ?  Sig: Take 1 tablet (40 mg total) by mouth 2 (two) times daily.  ?  Dispense:  180 tablet  ?  Refill:  3   ? ?Pt. To work on carbs and regular exercise. Will not change meds. A1c is 7.2 today. ? ?Encouraged 8 hours sleep nightly before considering specific treatment for the fatigue. Labs as above. ? ?Follow-up: Return in about 3 months (around 08/12/2021). ? ?Claretta Fraise, M.D. ? ?

## 2021-05-14 ENCOUNTER — Other Ambulatory Visit: Payer: Self-pay | Admitting: Family Medicine

## 2021-05-14 ENCOUNTER — Other Ambulatory Visit: Payer: 59

## 2021-05-14 DIAGNOSIS — R5383 Other fatigue: Secondary | ICD-10-CM | POA: Diagnosis not present

## 2021-05-14 DIAGNOSIS — E119 Type 2 diabetes mellitus without complications: Secondary | ICD-10-CM | POA: Diagnosis not present

## 2021-05-14 DIAGNOSIS — D509 Iron deficiency anemia, unspecified: Secondary | ICD-10-CM

## 2021-05-14 LAB — CBC WITH DIFFERENTIAL/PLATELET
Basophils Absolute: 0 10*3/uL (ref 0.0–0.2)
Basos: 1 %
EOS (ABSOLUTE): 0.4 10*3/uL (ref 0.0–0.4)
Eos: 4 %
Hematocrit: 36.1 % — ABNORMAL LOW (ref 37.5–51.0)
Hemoglobin: 11.1 g/dL — ABNORMAL LOW (ref 13.0–17.7)
Immature Grans (Abs): 0 10*3/uL (ref 0.0–0.1)
Immature Granulocytes: 0 %
Lymphocytes Absolute: 3.2 10*3/uL — ABNORMAL HIGH (ref 0.7–3.1)
Lymphs: 37 %
MCH: 21.7 pg — ABNORMAL LOW (ref 26.6–33.0)
MCHC: 30.7 g/dL — ABNORMAL LOW (ref 31.5–35.7)
MCV: 71 fL — ABNORMAL LOW (ref 79–97)
Monocytes Absolute: 0.8 10*3/uL (ref 0.1–0.9)
Monocytes: 10 %
Neutrophils Absolute: 4.2 10*3/uL (ref 1.4–7.0)
Neutrophils: 48 %
Platelets: 261 10*3/uL (ref 150–450)
RBC: 5.12 x10E6/uL (ref 4.14–5.80)
RDW: 16.6 % — ABNORMAL HIGH (ref 11.6–15.4)
WBC: 8.6 10*3/uL (ref 3.4–10.8)

## 2021-05-14 LAB — LIPID PANEL
Chol/HDL Ratio: 3 ratio (ref 0.0–5.0)
Cholesterol, Total: 158 mg/dL (ref 100–199)
HDL: 52 mg/dL (ref >39)
LDL Chol Calc (NIH): 90 mg/dL (ref 0–99)
Triglycerides: 84 mg/dL (ref 0–149)
VLDL Cholesterol Cal: 16 mg/dL (ref 5–40)

## 2021-05-14 LAB — CMP14+EGFR
ALT: 29 IU/L (ref 0–44)
AST: 35 IU/L (ref 0–40)
Albumin/Globulin Ratio: 1.1 — ABNORMAL LOW (ref 1.2–2.2)
Albumin: 4.3 g/dL (ref 3.8–4.9)
Alkaline Phosphatase: 52 IU/L (ref 44–121)
BUN/Creatinine Ratio: 22 — ABNORMAL HIGH (ref 9–20)
BUN: 15 mg/dL (ref 6–24)
Bilirubin Total: 0.3 mg/dL (ref 0.0–1.2)
CO2: 20 mmol/L (ref 20–29)
Calcium: 9.5 mg/dL (ref 8.7–10.2)
Chloride: 103 mmol/L (ref 96–106)
Creatinine, Ser: 0.69 mg/dL — ABNORMAL LOW (ref 0.76–1.27)
Globulin, Total: 3.8 g/dL (ref 1.5–4.5)
Glucose: 152 mg/dL — ABNORMAL HIGH (ref 70–99)
Potassium: 4.2 mmol/L (ref 3.5–5.2)
Sodium: 140 mmol/L (ref 134–144)
Total Protein: 8.1 g/dL (ref 6.0–8.5)
eGFR: 108 mL/min/{1.73_m2} (ref >59)

## 2021-05-14 LAB — IRON,TIBC AND FERRITIN PANEL
Ferritin: 12 ng/mL — ABNORMAL LOW (ref 30–400)
Iron Saturation: 7 % — CL (ref 15–55)
Iron: 37 ug/dL — ABNORMAL LOW (ref 38–169)
Total Iron Binding Capacity: 506 ug/dL — ABNORMAL HIGH (ref 250–450)
UIBC: 469 ug/dL — ABNORMAL HIGH (ref 111–343)

## 2021-05-15 LAB — SPECIMEN STATUS REPORT

## 2021-05-15 LAB — TESTOSTERONE,FREE AND TOTAL
Testosterone, Free: 4 pg/mL — ABNORMAL LOW (ref 7.2–24.0)
Testosterone: 135 ng/dL — ABNORMAL LOW (ref 264–916)

## 2021-05-15 LAB — TSH: TSH: 1.66 u[IU]/mL (ref 0.450–4.500)

## 2021-05-16 ENCOUNTER — Telehealth: Payer: Self-pay | Admitting: Emergency Medicine

## 2021-05-16 NOTE — Telephone Encounter (Signed)
Patient cannot afford Protonix wants to have something else called in. Also wants to know if Iron sent to pharmacy so insurance will pay for it.  ?

## 2021-05-19 ENCOUNTER — Other Ambulatory Visit: Payer: Self-pay | Admitting: Family Medicine

## 2021-05-19 MED ORDER — ESOMEPRAZOLE MAGNESIUM 40 MG PO CPDR: 40.0000 mg | DELAYED_RELEASE_CAPSULE | Freq: Every day | ORAL | 3 refills | Status: DC

## 2021-05-19 MED ORDER — IRON (FERROUS SULFATE) 325 (65 FE) MG PO TABS: 325.0000 mg | ORAL_TABLET | Freq: Two times a day (BID) | ORAL | 3 refills | Status: DC

## 2021-05-19 NOTE — Telephone Encounter (Signed)
II sent in the iron and also nexium to replace pantoprazole ?

## 2021-05-20 ENCOUNTER — Telehealth: Payer: Self-pay | Admitting: Family Medicine

## 2021-05-20 NOTE — Telephone Encounter (Signed)
KeyMarybelle [place] - PA Case ID: [id number] Need help? Call us at [phone] ?Status Sent to [employer] ?Drug Esomeprazole Magnesium '40MG'$  dr capsule ?

## 2021-05-20 NOTE — Telephone Encounter (Signed)
Pharmacy is calling to let us know that omeprazole (PRILOSEC) 40 MG capsule is needing a Prior Auth completed before they can fill.  ?

## 2021-05-20 NOTE — Telephone Encounter (Signed)
We?re pleased to let you know that we?ve approved your or your doctor?s request for coverage for Esomeprazole Mag Cap '40mg'$  DR. You can now fill your prescription, and it will be covered according to your plan. As long as you remain covered by your prescription drug plan and there are no changes to your plan benefits, this request is approved from 05/20/2021 to 05/21/2022. When this approval expires, please speak to your doctor about your treatment. ?

## 2021-05-20 NOTE — Telephone Encounter (Signed)
PA in process for esomeprazole.  ?

## 2021-05-20 NOTE — Telephone Encounter (Signed)
omeprazole (PRILOSEC) 40 MG capsule ?Pharmacy comment: Alternative Requested:PRIOR AUTHORIZATION NEEDED FOR MORE THAN 90 TABLETS EVERY 365 DAYS. ?

## 2021-05-20 NOTE — Telephone Encounter (Signed)
CALLED PATIENT, NO ANSWER, LEFT DETAILED VOICE MESSAGE ?

## 2021-06-10 ENCOUNTER — Telehealth: Payer: Self-pay | Admitting: Family Medicine

## 2021-06-10 NOTE — Telephone Encounter (Signed)
What is his question/ ?

## 2021-06-10 NOTE — Telephone Encounter (Signed)
Janumet extended ?

## 2021-06-10 NOTE — Telephone Encounter (Signed)
Spoke with pharmacy and clarification given  ?

## 2021-06-10 NOTE — Telephone Encounter (Signed)
Please call back to clarify if patient is on sitaGLIPtin-metformin (JANUMET) 50-1000 MG tablet or Janumet relief. ?

## 2021-07-25 DIAGNOSIS — H40023 Open angle with borderline findings, high risk, bilateral: Secondary | ICD-10-CM | POA: Diagnosis not present

## 2021-07-25 DIAGNOSIS — E119 Type 2 diabetes mellitus without complications: Secondary | ICD-10-CM | POA: Diagnosis not present

## 2021-07-25 DIAGNOSIS — Z961 Presence of intraocular lens: Secondary | ICD-10-CM | POA: Diagnosis not present

## 2021-07-29 ENCOUNTER — Ambulatory Visit (INDEPENDENT_AMBULATORY_CARE_PROVIDER_SITE_OTHER): Payer: 59 | Admitting: Nurse Practitioner

## 2021-07-29 ENCOUNTER — Encounter: Payer: Self-pay | Admitting: Nurse Practitioner

## 2021-07-29 VITALS — BP 128/79 | HR 78 | Temp 97.1°F | Resp 20 | Ht 72.0 in | Wt 221.0 lb

## 2021-07-29 DIAGNOSIS — S161XXA Strain of muscle, fascia and tendon at neck level, initial encounter: Secondary | ICD-10-CM

## 2021-07-29 DIAGNOSIS — M79672 Pain in left foot: Secondary | ICD-10-CM | POA: Diagnosis not present

## 2021-07-29 MED ORDER — PREDNISONE 20 MG PO TABS
40.0000 mg | ORAL_TABLET | Freq: Every day | ORAL | 0 refills | Status: AC
Start: 2021-07-29 — End: 2021-08-03

## 2021-07-29 MED ORDER — PREDNISONE 10 MG (21) PO TBPK: ORAL_TABLET | ORAL | 0 refills | Status: DC

## 2021-07-29 NOTE — Patient Instructions (Signed)
Heel Spur  A heel spur is a bony growth that forms on the bottom of the heel bone (calcaneus). Heel spurs are common. They often cause inflammation in the plantar fascia, which is the band of tissue that connects the toe bones to the heel bone. When the plantar fascia is inflamed, it is called plantar fasciitis. This may cause pain on the bottom of the foot, near the heel. Many people with plantar fasciitis also have heel spurs. However, spurs are not the cause of plantar fasciitis pain. What are the causes? The exact cause of heel spurs is not known. They may be caused by: Pressure on the heel bone. Bands of tissues that connect muscle to bone (tendons) pulling on the heel bone. What increases the risk? You are more likely to develop this condition if you: Are older than age 40. Are overweight. Have wear-and-tear arthritis (osteoarthritis). Have plantar fascia inflammation. Participate in sports or activities that include a lot of running or jumping. Wear poorly fitted shoes. What are the signs or symptoms? Some people have no symptoms. If you do have symptoms, they may include: Pain in the bottom of your heel. Pain that is worse when you first get out of bed. Pain that gets worse after walking or standing. How is this diagnosed? This condition may be diagnosed based on: Your symptoms and medical history. A physical exam. A foot X-ray. How is this treated? Treatment for this condition depends on how much pain you have. Treatment options may include: Doing stretching exercises and losing weight, if necessary. Wearing specific shoes or inserts inside of shoes (orthotics) for comfort and support. Wearing splints on your feet while you sleep. Splints keep your feet in a position (usually a 90-degree angle) that should prevent and relieve the pain you feel when you first get out of bed. They also make stretching easier in the morning. Taking over-the-counter medicine to relieve pain, such  as NSAIDs. Using high-intensity sound waves to break up the heel spur (extracorporeal shock wave therapy). Getting steroid injections in your heel to reduce inflammation. Having surgery, if your heel spur causes long-term (chronic) pain. Follow these instructions at home:  Activity Avoid activities that cause pain until you recover, or for as long as told by your health care provider. Do stretching exercises as told. Stretch before exercising or being physically active. Managing pain, stiffness, and swelling If directed, put ice on your foot. To do this: Put ice in a plastic bag. Place a towel between your skin and the bag. Leave the ice on for 20 minutes, 2-3 times a day. Remove the ice if your skin turns bright red. This is very important. If you cannot feel pain, heat, or cold, you have a greater risk of damage to the area. Move your toes often to reduce stiffness and swelling. When possible, raise (elevate) your foot above the level of your heart while you are sitting or lying down. General instructions Take over-the-counter and prescription medicines only as told by your health care provider. Wear supportive shoes that fit well. Wear splints, inserts, or orthotics as told by your health care provider. If recommended, work with your health care provider to lose weight. This can relieve pressure on your foot. Do not use any products that contain nicotine or tobacco, such as cigarettes, e-cigarettes, and chewing tobacco. These can delay bone healing. If you need help quitting, ask your health care provider. Keep all follow-up visits. This is important. Where to find more information American   [employer]: www.orthoinfo.aaos.org Contact a health care provider if: Your pain does not go away with treatment. Your pain gets worse. Summary A heel spur is a bony growth that forms on the bottom of the heel bone (calcaneus). Heel spurs often cause inflammation in the plantar  fascia, which is the band of tissue that connects the toes to the heel bone. This may cause pain on the bottom of the foot, near the heel. Doing stretching exercises, losing weight, wearing specific shoes or shoe inserts, wearing splints while you sleep, and taking pain medicine may ease the pain and stiffness. Other treatment options may include high-intensity sound waves to break up the heel spur, steroid injections, or surgery. This information is not intended to replace advice given to you by your health care provider. Make sure you discuss any questions you have with your health care provider. Document Revised: 05/10/2019 Document Reviewed: 05/10/2019 Elsevier Patient Education  2023 [employer].  

## 2021-07-29 NOTE — Progress Notes (Signed)
Subjective:    Patient ID: Kenneth Stone, male    DOB: [birth year], 56 y.o.   MRN: [id number]   Chief Complaint: Neck stiff and Left foot hurting (Worse in the morning when he gets up/)   HPI  Patient in C/O : - left foot pain. Near heel. Justus Memory on occurs when he is walking. Started a couple of weeks ago. Has tried tylenol with no relief. - neck stiffness for last 2 days. Tylenol helps some.       Review of Systems  Constitutional:  Negative for diaphoresis.  Eyes:  Negative for pain.  Respiratory:  Negative for shortness of breath.   Cardiovascular:  Negative for chest pain, palpitations and leg swelling.  Gastrointestinal:  Negative for abdominal pain.  Endocrine: Negative for polydipsia.  Skin:  Negative for rash.  Neurological:  Negative for dizziness, weakness and headaches.  Hematological:  Does not bruise/bleed easily.  All other systems reviewed and are negative.      Objective:   Physical Exam Vitals and nursing note reviewed.  Constitutional:      Appearance: Normal appearance. He is well-developed.  Neck:     Thyroid: No thyroid mass or thyromegaly.     Vascular: No carotid bruit or JVD.     Trachea: Phonation normal.  Cardiovascular:     Rate and Rhythm: Normal rate and regular rhythm.  Pulmonary:     Effort: Pulmonary effort is normal. No respiratory distress.     Breath sounds: Normal breath sounds.  Abdominal:     General: Bowel sounds are normal.     Palpations: Abdomen is soft.     Tenderness: There is no abdominal tenderness.  Musculoskeletal:        General: Normal range of motion.     Cervical back: Normal range of motion and neck supple.     Comments: Pain on palpation of left heel- apeears slightly swollen FROM of cervical spine with pain on rotation  Grips equal bil.  Lymphadenopathy:     Cervical: No cervical adenopathy.  Skin:    General: Skin is warm and dry.  Neurological:     Mental Status: He is alert and oriented to person, place,  and time.  Psychiatric:        Behavior: Behavior normal.        Thought Content: Thought content normal.        Judgment: Judgment normal.    BP 128/79   Pulse 78   Temp (!) 97.1 F (36.2 C) (Temporal)   Resp 20   Ht 6' (1.829 m)   Wt 221 lb (100.2 kg)   SpO2 99%   BMI 29.97 kg/m         Assessment & Plan:   Kenneth Stone in today with chief complaint of Neck stiff and Left foot hurting (Worse in the morning when he gets up/)   1. Intractable left heel pain 2. Strain of neck muscle, initial encounter Ice area Rest Neck stretches RTO prn Blood sugars may go up some on steroids Meds ordered this encounter  Medications   DISCONTD: predniSONE (STERAPRED UNI-PAK 21 TAB) 10 MG (21) TBPK tablet    Sig: As directed x 6 days    Dispense:  21 tablet    Refill:  0    Order Specific Question:   Supervising Provider    Answer:   Caryl Pina A [[id number]]   predniSONE (DELTASONE) 20 MG tablet    Sig: Take 2 tablets (40 mg  total) by mouth daily with breakfast for 5 days. 2 po daily for 5 days    Dispense:  10 tablet    Refill:  0    Order Specific Question:   Supervising Provider    Answer:   Caryl Pina A [[id number]]         The above assessment and management plan was discussed with the patient. The patient verbalized understanding of and has agreed to the management plan. Patient is aware to call the clinic if symptoms persist or worsen. Patient is aware when to return to the clinic for a follow-up visit. Patient educated on when it is appropriate to go to the emergency department.   Mary-Margaret Hassell Done, FNP

## 2021-08-20 DIAGNOSIS — M84372A Stress fracture, left ankle, initial encounter for fracture: Secondary | ICD-10-CM | POA: Diagnosis not present

## 2021-08-20 DIAGNOSIS — M79672 Pain in left foot: Secondary | ICD-10-CM | POA: Diagnosis not present

## 2021-08-20 DIAGNOSIS — M722 Plantar fascial fibromatosis: Secondary | ICD-10-CM | POA: Diagnosis not present

## 2021-08-27 ENCOUNTER — Encounter: Payer: Self-pay | Admitting: Family Medicine

## 2021-08-27 ENCOUNTER — Ambulatory Visit (INDEPENDENT_AMBULATORY_CARE_PROVIDER_SITE_OTHER): Payer: 59 | Admitting: Family Medicine

## 2021-08-27 VITALS — Ht 72.0 in

## 2021-08-27 DIAGNOSIS — E785 Hyperlipidemia, unspecified: Secondary | ICD-10-CM

## 2021-08-27 DIAGNOSIS — D509 Iron deficiency anemia, unspecified: Secondary | ICD-10-CM | POA: Diagnosis not present

## 2021-08-27 DIAGNOSIS — E119 Type 2 diabetes mellitus without complications: Secondary | ICD-10-CM

## 2021-08-27 DIAGNOSIS — M159 Polyosteoarthritis, unspecified: Secondary | ICD-10-CM

## 2021-08-27 DIAGNOSIS — M150 Primary generalized (osteo)arthritis: Secondary | ICD-10-CM

## 2021-08-27 DIAGNOSIS — K2990 Gastroduodenitis, unspecified, without bleeding: Secondary | ICD-10-CM | POA: Diagnosis not present

## 2021-08-27 DIAGNOSIS — M4712 Other spondylosis with myelopathy, cervical region: Secondary | ICD-10-CM | POA: Diagnosis not present

## 2021-08-27 DIAGNOSIS — I10 Essential (primary) hypertension: Secondary | ICD-10-CM

## 2021-08-27 DIAGNOSIS — Z23 Encounter for immunization: Secondary | ICD-10-CM

## 2021-08-27 LAB — CMP14+EGFR
ALT: 34 IU/L (ref 0–44)
AST: 31 IU/L (ref 0–40)
Albumin/Globulin Ratio: 1.3 (ref 1.2–2.2)
Albumin: 4.4 g/dL (ref 3.8–4.9)
Alkaline Phosphatase: 44 IU/L (ref 44–121)
BUN/Creatinine Ratio: 15 (ref 9–20)
BUN: 11 mg/dL (ref 6–24)
Bilirubin Total: 0.4 mg/dL (ref 0.0–1.2)
CO2: 23 mmol/L (ref 20–29)
Calcium: 9.5 mg/dL (ref 8.7–10.2)
Chloride: 101 mmol/L (ref 96–106)
Creatinine, Ser: 0.72 mg/dL — ABNORMAL LOW (ref 0.76–1.27)
Globulin, Total: 3.3 g/dL (ref 1.5–4.5)
Glucose: 104 mg/dL — ABNORMAL HIGH (ref 70–99)
Potassium: 4.4 mmol/L (ref 3.5–5.2)
Sodium: 137 mmol/L (ref 134–144)
Total Protein: 7.7 g/dL (ref 6.0–8.5)
eGFR: 107 mL/min/{1.73_m2} (ref >59)

## 2021-08-27 LAB — CBC WITH DIFFERENTIAL/PLATELET
Basophils Absolute: 0 10*3/uL (ref 0.0–0.2)
Basos: 1 %
EOS (ABSOLUTE): 0.2 10*3/uL (ref 0.0–0.4)
Eos: 3 %
Hematocrit: 40.5 % (ref 37.5–51.0)
Hemoglobin: 13.3 g/dL (ref 13.0–17.7)
Immature Grans (Abs): 0 10*3/uL (ref 0.0–0.1)
Immature Granulocytes: 1 %
Lymphocytes Absolute: 2.6 10*3/uL (ref 0.7–3.1)
Lymphs: 41 %
MCH: 24.4 pg — ABNORMAL LOW (ref 26.6–33.0)
MCHC: 32.8 g/dL (ref 31.5–35.7)
MCV: 74 fL — ABNORMAL LOW (ref 79–97)
Monocytes Absolute: 0.6 10*3/uL (ref 0.1–0.9)
Monocytes: 10 %
Neutrophils Absolute: 2.8 10*3/uL (ref 1.4–7.0)
Neutrophils: 44 %
Platelets: 251 10*3/uL (ref 150–450)
RBC: 5.44 x10E6/uL (ref 4.14–5.80)
RDW: 17.1 % — ABNORMAL HIGH (ref 11.6–15.4)
WBC: 6.3 10*3/uL (ref 3.4–10.8)

## 2021-08-27 LAB — LIPID PANEL
Chol/HDL Ratio: 3.7 ratio (ref 0.0–5.0)
Cholesterol, Total: 187 mg/dL (ref 100–199)
HDL: 51 mg/dL (ref >39)
LDL Chol Calc (NIH): 123 mg/dL — ABNORMAL HIGH (ref 0–99)
Triglycerides: 71 mg/dL (ref 0–149)
VLDL Cholesterol Cal: 13 mg/dL (ref 5–40)

## 2021-08-27 LAB — BAYER DCA HB A1C WAIVED: HB A1C (BAYER DCA - WAIVED): 7.6 % — ABNORMAL HIGH (ref 4.8–5.6)

## 2021-08-27 MED ORDER — CELECOXIB 200 MG PO CAPS: 200.0000 mg | ORAL_CAPSULE | Freq: Every day | ORAL | 5 refills | Status: DC

## 2021-08-27 MED ORDER — ONETOUCH VERIO VI STRP: ORAL_STRIP | 11 refills | Status: DC

## 2021-08-27 NOTE — Progress Notes (Addendum)
Subjective:  Patient ID: Kenneth Stone,  male    DOB: Feb 06, 1965  Age: 56 y.o.    CC: Medical Management of Chronic Issues   HPI Kenneth Stone presents for  follow-up of hypertension. Patient has no history of headache chest pain or shortness of breath or recent cough. Patient also denies symptoms of TIA such as numbness weakness lateralizing. Patient denies side effects from medication. States taking it regularly.  Patient also  in for follow-up of elevated cholesterol. Doing well without complaints on current medication. Denies side effects  including myalgia and arthralgia and nausea. Also in today for liver function testing. Currently no chest pain, shortness of breath or other cardiovascular related symptoms noted.  Follow-up of diabetes. Patient does check blood sugar at home. Readings run between 140 and 200 Patient denies symptoms such as excessive hunger or urinary frequency, excessive hunger, nausea No significant hypoglycemic spells noted. Medications reviewed. Pt reports taking them regularly. Pt. denies complication/adverse reaction today.    History Kenneth Stone has a past medical history of Diabetes mellitus without complication ([place]), Essential hypertension (09/05/2014), GERD (gastroesophageal reflux disease), Hyperlipidemia (12/31/2014), and Spondylosis, cervical, 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   He has a past surgical history that includes Colonoscopy (N/A, 01/23/2017); Colonoscopy with propofol (N/A, 04/17/2021); Esophagogastroduodenoscopy (egd) with propofol (N/A, 04/17/2021); and biopsy (04/17/2021).   His family history includes Cancer in his mother; Heart disease in his father.He reports that he has never smoked. He has never used smokeless tobacco. He reports current alcohol use of about 2.0 standard drinks of alcohol per week. He reports that he does not use drugs.  Current Outpatient Medications on File Prior to Visit  Medication Sig Dispense Refill   atorvastatin (LIPITOR) 20  MG tablet Take 1 tablet (20 mg total) by mouth daily. 90 tablet 3   Blood Glucose Monitoring Suppl (ONETOUCH VERIO) w/Device KIT Test BS BID Dx E11.9 1 kit 0   empagliflozin (JARDIANCE) 25 MG TABS tablet Take 1 tablet (25 mg total) by mouth daily. 90 tablet 3   esomeprazole (NEXIUM) 40 MG capsule Take 1 capsule (40 mg total) by mouth daily. 90 capsule 3   glipiZIDE (GLUCOTROL) 10 MG tablet Take 1 tablet (10 mg total) by mouth daily before breakfast. 90 tablet 3   Iron, Ferrous Sulfate, 325 (65 Fe) MG TABS Take 325 mg by mouth 2 (two) times daily. 180 tablet 3   ONETOUCH VERIO test strip TEST BLOOD SUGAR TWICE DAILY E11.9 50 strip 11   quinapril (ACCUPRIL) 40 MG tablet TAKE 1 TABLET BY MOUTH EVERYDAY AT BEDTIME (Patient taking differently: Take 40 mg by mouth at bedtime.) 90 tablet 3   sitaGLIPtin-metformin (JANUMET) 50-1000 MG tablet Take 1 tablet by mouth 2 (two) times daily with a meal. 180 tablet 3   No current facility-administered medications on file prior to visit.    ROS Review of Systems  Constitutional:  Negative for fever.  Respiratory:  Negative for shortness of breath.   Cardiovascular:  Negative for chest pain.  Musculoskeletal:  Positive for arthralgias and neck pain (persistent, moderate. XR review from 2018 shows multiple spurs).  Skin:  Negative for rash.    Objective:  Ht 6' (1.829 m)   BMI 29.97 kg/m   BP Readings from Last 3 Encounters:  07/29/21 128/79  05/13/21 125/78  04/17/21 104/67    Wt Readings from Last 3 Encounters:  07/29/21 221 lb (100.2 kg)  05/13/21 221 lb 6.4 oz (100.4 kg)  04/17/21 223 lb (101.2 kg)  Physical Exam Vitals reviewed.  Constitutional:      Appearance: He is well-developed.  HENT:     Head: Normocephalic and atraumatic.     Right Ear: External ear normal.     Left Ear: External ear normal.     Mouth/Throat:     Pharynx: No oropharyngeal exudate or posterior oropharyngeal erythema.  Eyes:     Pupils: Pupils are equal,  round, and reactive to light.  Cardiovascular:     Rate and Rhythm: Normal rate and regular rhythm.     Heart sounds: No murmur heard. Pulmonary:     Effort: No respiratory distress.     Breath sounds: Normal breath sounds.  Musculoskeletal:     Cervical back: Normal range of motion and neck supple.  Neurological:     Mental Status: He is alert and oriented to person, place, and time.     Diabetic Foot Exam - Simple   No data filed     Lab Results  Component Value Date   HGBA1C 7.2 (H) 05/13/2021   HGBA1C 7.4 (H) 02/13/2021   HGBA1C 6.9 05/28/2017    Assessment & Plan:   Shafin was seen today for medical management of chronic issues.  Diagnoses and all orders for this visit:  Controlled type 2 diabetes mellitus without complication, without long-term current use of insulin (HCC) -     Bayer DCA Hb A1c Waived  Iron deficiency anemia, unspecified iron deficiency anemia type -     CBC with Differential/Platelet  Essential hypertension -     CBC with Differential/Platelet -     CMP14+EGFR  Hyperlipidemia, unspecified hyperlipidemia type -     Lipid panel  Need for shingles vaccine -     Zoster Recombinant (Shingrix )  Gastritis and duodenitis  Primary osteoarthritis involving multiple joints  Spondylosis, cervical, with myelopathy  Other orders -     celecoxib (CELEBREX) 200 MG capsule; Take 1 capsule (200 mg total) by mouth daily. With food, for arthritis   I am having Kenneth Stone start on celecoxib. I am also having him maintain his atorvastatin, empagliflozin, glipiZIDE, OneTouch Verio, quinapril, OneTouch Verio, Janumet, Iron (Ferrous Sulfate), and esomeprazole.  Colonoscopy shows gastritis and duodenitis. Sx better. Taking nexium. Likely the cause of anemia.  Needs to start GLP1, will also need to stop DPP4 . DPP4 in combo currently with metformin, so metformin will ned order. Due to language issues. I would like for him to work with Lottie Dawson, PharmD  to make sure he is able to transition smoothly.  Follow-up: Return in about 3 months (around 11/27/2021).   Claretta Fraise, M.D.

## 2021-08-30 NOTE — Progress Notes (Signed)
Hello Jaser,  Your lab result is normal and/or stable.Some minor variations that are not significant are commonly marked abnormal, but do not represent any medical problem for you.  Best regards, Lenis Nettleton, M.D.

## 2021-09-05 ENCOUNTER — Ambulatory Visit: Payer: 59 | Admitting: Pharmacist

## 2021-09-18 ENCOUNTER — Other Ambulatory Visit: Payer: Self-pay | Admitting: Family Medicine

## 2021-09-19 ENCOUNTER — Ambulatory Visit (INDEPENDENT_AMBULATORY_CARE_PROVIDER_SITE_OTHER): Payer: 59 | Admitting: Pharmacist

## 2021-09-19 DIAGNOSIS — E119 Type 2 diabetes mellitus without complications: Secondary | ICD-10-CM | POA: Diagnosis not present

## 2021-09-19 MED ORDER — TRULICITY 0.75 MG/0.5ML ~~LOC~~ SOAJ: 0.7500 mg | SUBCUTANEOUS | 0 refills | Status: DC

## 2021-09-19 MED ORDER — ACCU-CHEK GUIDE W/DEVICE KIT: PACK | 0 refills | Status: AC

## 2021-09-19 NOTE — Progress Notes (Signed)
    09/19/2021 Name: Broden Holt MRN: [id number] DOB: 07/05/1965   S:  41 YOM Presents for diabetes evaluation, education, and management.  Patient was referred and last seen by Primary Care Provider on 08/27/21.  Patient is interest in starting GLP1 once weekly injections in the place of his twice daily Janumet.  His a1c is slightly above goal at 7.6%.  Insurance coverage/medication affordability: [employer]  Patient reports adherence with medications. Current diabetes medications include: janumet, glipizide, jardiance Current hypertension medications include: quinapril Goal 130/80 Current hyperlipidemia medications include: atorvastatin   Patient denies hypoglycemic events.   Patient reported dietary habits: Eats 2 meals/day  Patient-reported exercise habits: encouraged  Patient denies nocturia (nighttime urination).  Patient denies neuropathy (nerve pain).  Patient denies visual changes.  Patient reports self foot exams.    O:  Lab Results  Component Value Date   HGBA1C 7.6 (H) 08/27/2021   Lipid Panel     Component Value Date/Time   CHOL 187 08/27/2021 1021   TRIG 71 08/27/2021 1021   HDL 51 08/27/2021 1021   CHOLHDL 3.7 08/27/2021 1021   LDLCALC 123 (H) 08/27/2021 1021   LDLDIRECT 77 04/29/2016 1505     Home fasting blood sugars: <150  2 hour post-meal/random blood sugars: N/A.    Clinical Atherosclerotic Cardiovascular Disease (ASCVD): No   The 10-year ASCVD risk score (Arnett DK, et al., 2019) is: 13.4%   Values used to calculate the score:     Age: 29 years     Sex: Male     Is Non-Hispanic African American: No     Diabetic: Yes     Tobacco smoker: No     Systolic Blood Pressure: 010 mmHg     Is BP treated: Yes     HDL Cholesterol: 51 mg/dL     Total Cholesterol: 187 mg/dL    A/P:  Diabetes T2DM currently UNCONTROLLED, BUT IMPROVING. Patient is adherent with medication. Control is suboptimal due to diet/lifestyle.  -CONTINUE JARDIANCE COPAY  CARD GIVEN -CONT GLIPIZIDE FOR NOW -STOP JANUMET -WILL CALL IN PLAIN METFORMIN if needed -START TRULICITY 0.'75MG'$  Denies personal and family history of Medullary thyroid cancer (MTC) 4 week sample pack given  Plan to titrate to 1.'5mg'$  weekly at 4 week f/u as tolerated  [place]  -Extensively discussed pathophysiology of diabetes, recommended lifestyle interventions, dietary effects on blood sugar control  -Counseled on s/sx of and management of hypoglycemia  -Next A1C anticipated 3 months.   Written patient instructions provided.  Total time in face to face counseling 30 minutes.   Follow up Pharmacist Clinic Visit ON 10/15/21.    Regina Eck, PharmD, BCPS Clinical Pharmacist, [place]  II Phone [phone]

## 2021-10-07 ENCOUNTER — Telehealth: Payer: 59 | Admitting: Nurse Practitioner

## 2021-10-10 ENCOUNTER — Ambulatory Visit (INDEPENDENT_AMBULATORY_CARE_PROVIDER_SITE_OTHER): Payer: 59 | Admitting: Family Medicine

## 2021-10-10 ENCOUNTER — Encounter: Payer: Self-pay | Admitting: Family Medicine

## 2021-10-10 VITALS — BP 129/76 | HR 94 | Temp 97.0°F | Ht 72.0 in | Wt 219.0 lb

## 2021-10-10 DIAGNOSIS — E119 Type 2 diabetes mellitus without complications: Secondary | ICD-10-CM

## 2021-10-10 DIAGNOSIS — J0100 Acute maxillary sinusitis, unspecified: Secondary | ICD-10-CM

## 2021-10-10 MED ORDER — TRULICITY 1.5 MG/0.5ML ~~LOC~~ SOAJ: SUBCUTANEOUS | 2 refills | Status: DC

## 2021-10-10 MED ORDER — PSEUDOEPHEDRINE-GUAIFENESIN ER 120-1200 MG PO TB12: 1.0000 | ORAL_TABLET | Freq: Two times a day (BID) | ORAL | 0 refills | Status: DC

## 2021-10-10 MED ORDER — AMOXICILLIN-POT CLAVULANATE 875-125 MG PO TABS: 1.0000 | ORAL_TABLET | Freq: Two times a day (BID) | ORAL | 0 refills | Status: DC

## 2021-10-10 NOTE — Progress Notes (Signed)
Subjective:  Patient ID: Kenneth Stone, male    DOB: [birth year]  Age: 56 y.o. MRN: [id number]  CC: Sinusitis   HPI Kenneth Stone presents for Symptoms include congestion, facial pain, nasal congestion, no cough, post nasal drip and sinus pressure. There is no fever, chills, or sweats. Onset of symptoms was 10 days ago, gradually worsening since that time.    Glucose better. Running 140-160. Out of the [employer] with Janumet. Still taking glucotrol and farxiga. Denies low glucose sx. .      10/10/2021    3:44 PM 08/27/2021   10:15 AM 05/13/2021   12:57 PM  Depression screen PHQ 2/9  Decreased Interest 0 0 0  Down, Depressed, Hopeless 0 0 0  PHQ - 2 Score 0 0 0    History Kenneth Stone has a past medical history of Diabetes mellitus without complication ([place]), Essential hypertension (09/05/2014), GERD (gastroesophageal reflux disease), Hyperlipidemia (12/31/2014), and Spondylosis, cervical, 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   He has a past surgical history that includes Colonoscopy (N/A, 01/23/2017); Colonoscopy with propofol (N/A, 04/17/2021); Esophagogastroduodenoscopy (egd) with propofol (N/A, 04/17/2021); and biopsy (04/17/2021).   His family history includes Cancer in his mother; Heart disease in his father.He reports that he has never smoked. He has never used smokeless tobacco. He reports current alcohol use of about 2.0 standard drinks of alcohol per week. He reports that he does not use drugs.    ROS Review of Systems  Constitutional:  Negative for activity change, appetite change, chills and fever.  HENT:  Positive for congestion, postnasal drip, rhinorrhea and sinus pressure. Negative for ear discharge, ear pain, hearing loss, nosebleeds, sneezing and trouble swallowing.   Respiratory:  Negative for chest tightness and shortness of breath.   Cardiovascular:  Negative for chest pain and palpitations.  Skin:  Negative for rash.    Objective:  BP 129/76   Pulse 94   Temp (!)  97 F (36.1 C)   Ht 6' (1.829 m)   Wt 219 lb (99.3 kg)   SpO2 98%   BMI 29.70 kg/m   BP Readings from Last 3 Encounters:  10/10/21 129/76  07/29/21 128/79  05/13/21 125/78    Wt Readings from Last 3 Encounters:  10/10/21 219 lb (99.3 kg)  07/29/21 221 lb (100.2 kg)  05/13/21 221 lb 6.4 oz (100.4 kg)     Physical Exam Vitals reviewed.  Constitutional:      Appearance: He is well-developed.  HENT:     Head: Normocephalic and atraumatic.     Right Ear: Tympanic membrane and external ear normal. No decreased hearing noted.     Left Ear: Tympanic membrane and external ear normal. No decreased hearing noted.     Nose: Mucosal edema present.     Right Sinus: No frontal sinus tenderness.     Left Sinus: No frontal sinus tenderness.     Mouth/Throat:     Pharynx: No oropharyngeal exudate or posterior oropharyngeal erythema.  Eyes:     Pupils: Pupils are equal, round, and reactive to light.  Neck:     Meningeal: Brudzinski's sign absent.  Cardiovascular:     Rate and Rhythm: Normal rate and regular rhythm.     Heart sounds: No murmur heard. Pulmonary:     Effort: No respiratory distress.     Breath sounds: Normal breath sounds.  Musculoskeletal:     Cervical back: Normal range of motion and neck supple.  Lymphadenopathy:     Head:     Right  side of head: No preauricular adenopathy.     Left side of head: No preauricular adenopathy.     Cervical:     Right cervical: No superficial cervical adenopathy.    Left cervical: No superficial cervical adenopathy.  Neurological:     Mental Status: He is alert and oriented to person, place, and time.       Assessment & Plan:   Tiron was seen today for sinusitis.  Diagnoses and all orders for this visit:  Controlled type 2 diabetes mellitus without complication, without long-term current use of insulin (HCC)  Acute maxillary sinusitis, recurrence not specified  Other orders -     Dulaglutide (TRULICITY) 1.5 NA/3.5TD  SOPN; Inject content of one pen under the skin weekly -     amoxicillin-clavulanate (AUGMENTIN) 875-125 MG tablet; Take 1 tablet by mouth 2 (two) times daily. Take all of this medication -     Pseudoephedrine-Guaifenesin [phone] MG TB12; Take 1 tablet by mouth 2 (two) times daily. For congestion       I have discontinued Bastian Carboni's Trulicity. I am also having him start on Trulicity, amoxicillin-clavulanate, and Pseudoephedrine-Guaifenesin. Additionally, I am having him maintain his atorvastatin, empagliflozin, glipiZIDE, quinapril, Iron (Ferrous Sulfate), esomeprazole, celecoxib, Accu-Chek Guide, and Accu-Chek Guide.  Allergies as of 10/10/2021   No Known Allergies      Medication List        Accurate as of October 10, 2021  4:03 PM. If you have any questions, ask your nurse or doctor.          STOP taking these medications    Trulicity 3.22 GU/5.4YH Sopn Generic drug: Dulaglutide Replaced by: Trulicity 1.5 CW/2.3JS Sopn Stopped by: Claretta Fraise, MD       TAKE these medications    Accu-Chek Guide test strip Generic drug: glucose blood Test BS BID Dx E11.9   Accu-Chek Guide w/Device Kit Test BS BID Dx E11.9   amoxicillin-clavulanate 875-125 MG tablet Commonly known as: AUGMENTIN Take 1 tablet by mouth 2 (two) times daily. Take all of this medication Started by: Claretta Fraise, MD   atorvastatin 20 MG tablet Commonly known as: LIPITOR Take 1 tablet (20 mg total) by mouth daily.   celecoxib 200 MG capsule Commonly known as: CeleBREX Take 1 capsule (200 mg total) by mouth daily. With food, for arthritis   empagliflozin 25 MG Tabs tablet Commonly known as: Jardiance Take 1 tablet (25 mg total) by mouth daily.   esomeprazole 40 MG capsule Commonly known as: NexIUM Take 1 capsule (40 mg total) by mouth daily.   glipiZIDE 10 MG tablet Commonly known as: GLUCOTROL Take 1 tablet (10 mg total) by mouth daily before breakfast.   Iron (Ferrous Sulfate)  325 (65 Fe) MG Tabs Take 325 mg by mouth 2 (two) times daily.   Pseudoephedrine-Guaifenesin [phone] MG Tb12 Take 1 tablet by mouth 2 (two) times daily. For congestion Started by: Claretta Fraise, MD   quinapril 40 MG tablet Commonly known as: ACCUPRIL TAKE 1 TABLET BY MOUTH EVERYDAY AT BEDTIME What changed:  how much to take how to take this when to take this additional instructions   Trulicity 1.5 EG/3.1DV Sopn Generic drug: Dulaglutide Inject content of one pen under the skin weekly Replaces: Trulicity 7.61 YW/7.3XT Sopn Started by: Claretta Fraise, MD         Follow-up: Return in about 6 weeks (around 11/21/2021).  Claretta Fraise, M.D.

## 2021-10-15 ENCOUNTER — Ambulatory Visit: Payer: 59 | Admitting: Pharmacist

## 2021-10-15 DIAGNOSIS — E119 Type 2 diabetes mellitus without complications: Secondary | ICD-10-CM

## 2021-10-17 ENCOUNTER — Telehealth: Payer: Self-pay | Admitting: Family Medicine

## 2021-10-17 NOTE — Telephone Encounter (Signed)
Pt calling about this message. Pt is out of the medicine and sugars are going up. Please call pharmacy.

## 2021-10-17 NOTE — Telephone Encounter (Signed)
Called [name] she states this is needing a PA Needs ASAP we have no samples.

## 2021-10-22 NOTE — Progress Notes (Signed)
  PHARMD Follow Up Note   10/15/2021 Name: Yeison Sippel MRN: [id number] DOB: 10/01/1965   Referred by: Claretta Fraise, MD Reason for referral : Diabetes  UNSUCCESSFUL OUTREACH TO PATIENT; VOICEMAIL LEFT REQUESTING RETURN CALL  Follow Up Plan: The patient has been provided with contact information for the care management team and has been advised to call with any health related questions or concerns.     Regina Eck, PharmD, BCPS Clinical Pharmacist, [place]  II Phone [phone]

## 2021-10-23 ENCOUNTER — Telehealth: Payer: Self-pay | Admitting: Family Medicine

## 2021-10-23 DIAGNOSIS — K219 Gastro-esophageal reflux disease without esophagitis: Secondary | ICD-10-CM

## 2021-10-23 NOTE — Telephone Encounter (Signed)
Patient calling to check on status of medication being approved. Please call back.

## 2021-10-24 MED ORDER — ESOMEPRAZOLE MAGNESIUM 40 MG PO CPDR: 40.0000 mg | DELAYED_RELEASE_CAPSULE | Freq: Every day | ORAL | 3 refills | Status: DC

## 2021-10-24 NOTE — Telephone Encounter (Signed)
Patient notified

## 2021-10-26 DIAGNOSIS — M25561 Pain in right knee: Secondary | ICD-10-CM | POA: Diagnosis not present

## 2021-11-05 NOTE — Telephone Encounter (Signed)
Approvedon September 28 Your PA request has been approved. Additional information will be provided in the approval communication. (Message 1145)

## 2021-11-19 ENCOUNTER — Encounter: Payer: Self-pay | Admitting: Nurse Practitioner

## 2021-11-19 ENCOUNTER — Ambulatory Visit (INDEPENDENT_AMBULATORY_CARE_PROVIDER_SITE_OTHER): Payer: 59 | Admitting: Nurse Practitioner

## 2021-11-19 DIAGNOSIS — U071 COVID-19: Secondary | ICD-10-CM | POA: Diagnosis not present

## 2021-11-19 MED ORDER — NIRMATRELVIR/RITONAVIR (PAXLOVID)TABLET: 3.0000 | ORAL_TABLET | Freq: Two times a day (BID) | ORAL | 0 refills | Status: AC

## 2021-11-19 NOTE — Patient Instructions (Signed)

## 2021-11-19 NOTE — Progress Notes (Signed)
Virtual Visit  Note Due to COVID-19 pandemic this visit was conducted virtually. This visit type was conducted due to national recommendations for restrictions regarding the COVID-19 Pandemic (e.g. social distancing, sheltering in place) in an effort to limit this patient's exposure and mitigate transmission in our community. All issues noted in this document were discussed and addressed.  A physical exam was not performed with this format.  I connected with Kenneth Stone on 11/19/21 at 3:10 by telephone and verified that I am speaking with the correct person using two identifiers. Kenneth Stone is currently located at home and no one is currently with her during visit. The provider, Mary-Margaret Hassell Done, FNP is located in their office at time of visit.  I discussed the limitations, risks, security and privacy concerns of performing an evaluation and management service by telephone and the availability of in person appointments. I also discussed with the patient that there may be a patient responsible charge related to this service. The patient expressed understanding and agreed to proceed.   History and Present Illness:  Patient calls saying he feels terrible.  URI  This is a new problem. The current episode started in the past 7 days. The problem has been gradually worsening. The maximum temperature recorded prior to his arrival was 100.4 - 100.9 F. The fever has been present for 1 to 2 days. Associated symptoms include congestion, coughing, headaches, rhinorrhea, sneezing and a sore throat. Treatments tried: mucinex. The treatment provided mild relief.  Covid test was positive last night.    Review of Systems  HENT:  Positive for congestion, rhinorrhea, sneezing and sore throat.   Respiratory:  Positive for cough.   Neurological:  Positive for headaches.     Observations/Objective: Alert and oriented- answers all questions appropriately No distress Raspy voice Deep cough Sniffling  alot  Assessment and Plan: Kenneth Stone in today with chief complaint of Covid Positive   1. Positive self-administered antigen test for COVID-19 1. Take meds as prescribed 2. Use a cool mist humidifier especially during the winter months and when heat has been humid. 3. Use saline nose sprays frequently 4. Saline irrigations of the nose can be very helpful if done frequently.  * 4X daily for 1 week*  * Use of a nettie pot can be helpful with this. Follow directions with this* 5. Drink plenty of fluids 6. Keep thermostat turn down low 7.For any cough or congestion- mucinex OTC 8. For fever or aces or pains- take tylenol or ibuprofen appropriate for age and weight.  * for fevers greater than 101 orally you may alternate ibuprofen and tylenol every  3 hours.    Meds ordered this encounter  Medications   nirmatrelvir/ritonavir EUA (PAXLOVID) 20 x 150 MG & 10 x '100MG'$  TABS    Sig: Take 3 tablets by mouth 2 (two) times daily for 5 days. (Take nirmatrelvir 150 mg two tablets twice daily for 5 days and ritonavir 100 mg one tablet twice daily for 5 days) Patient GFR is 76    Dispense:  30 tablet    Refill:  0    Order Specific Question:   Supervising Provider    Answer:   Caryl Pina A [[id number]]    Follow Up Instructions: prn    I discussed the assessment and treatment plan with the patient. The patient was provided an opportunity to ask questions and all were answered. The patient agreed with the plan and demonstrated an understanding of the instructions.   The  patient was advised to call back or seek an in-person evaluation if the symptoms worsen or if the condition fails to improve as anticipated.  The above assessment and management plan was discussed with the patient. The patient verbalized understanding of and has agreed to the management plan. Patient is aware to call the clinic if symptoms persist or worsen. Patient is aware when to return to the clinic for a follow-up  visit. Patient educated on when it is appropriate to go to the emergency department.   Time call ended:  3:26  I provided 16 minutes of  non face-to-face time during this encounter.    Mary-Margaret Hassell Done, FNP

## 2021-11-27 ENCOUNTER — Ambulatory Visit: Payer: 59 | Admitting: Family Medicine

## 2021-11-27 ENCOUNTER — Encounter: Payer: Self-pay | Admitting: Family Medicine

## 2021-12-10 ENCOUNTER — Ambulatory Visit: Payer: 59 | Admitting: Family Medicine

## 2021-12-10 DIAGNOSIS — M1711 Unilateral primary osteoarthritis, right knee: Secondary | ICD-10-CM | POA: Diagnosis not present

## 2021-12-10 DIAGNOSIS — M25561 Pain in right knee: Secondary | ICD-10-CM | POA: Diagnosis not present

## 2021-12-24 ENCOUNTER — Other Ambulatory Visit: Payer: Self-pay | Admitting: Family Medicine

## 2021-12-25 ENCOUNTER — Other Ambulatory Visit: Payer: Self-pay | Admitting: Family Medicine

## 2021-12-25 MED ORDER — RAMIPRIL 10 MG PO CAPS: 10.0000 mg | ORAL_CAPSULE | Freq: Every day | ORAL | 3 refills | Status: DC

## 2022-01-08 ENCOUNTER — Encounter: Payer: Self-pay | Admitting: Nurse Practitioner

## 2022-01-08 ENCOUNTER — Ambulatory Visit (INDEPENDENT_AMBULATORY_CARE_PROVIDER_SITE_OTHER): Payer: 59 | Admitting: Nurse Practitioner

## 2022-01-08 DIAGNOSIS — J329 Chronic sinusitis, unspecified: Secondary | ICD-10-CM

## 2022-01-08 DIAGNOSIS — B9689 Other specified bacterial agents as the cause of diseases classified elsewhere: Secondary | ICD-10-CM

## 2022-01-08 DIAGNOSIS — R051 Acute cough: Secondary | ICD-10-CM | POA: Diagnosis not present

## 2022-01-08 DIAGNOSIS — R509 Fever, unspecified: Secondary | ICD-10-CM

## 2022-01-08 DIAGNOSIS — J0111 Acute recurrent frontal sinusitis: Secondary | ICD-10-CM | POA: Insufficient documentation

## 2022-01-08 MED ORDER — AMOXICILLIN-POT CLAVULANATE 875-125 MG PO TABS: 1.0000 | ORAL_TABLET | Freq: Two times a day (BID) | ORAL | 0 refills | Status: DC

## 2022-01-08 MED ORDER — GUAIFENESIN ER 600 MG PO TB12: 600.0000 mg | ORAL_TABLET | Freq: Two times a day (BID) | ORAL | 0 refills | Status: DC

## 2022-01-08 MED ORDER — BENZONATATE 100 MG PO CAPS: 100.0000 mg | ORAL_CAPSULE | Freq: Three times a day (TID) | ORAL | 0 refills | Status: DC | PRN

## 2022-01-08 NOTE — Progress Notes (Signed)
   Virtual Visit  Note Due to COVID-19 pandemic this visit was conducted virtually. This visit type was conducted due to national recommendations for restrictions regarding the COVID-19 Pandemic (e.g. social distancing, sheltering in place) in an effort to limit this patient's exposure and mitigate transmission in our community. All issues noted in this document were discussed and addressed.  A physical exam was not performed with this format.  I connected with Kenneth Stone on 01/08/22 at 12:50 PM telephone and verified that I am speaking with the correct person using two identifiers. Kenneth Stone is currently located at home during visit. The provider, Ivy Lynn, NP is located in their office at time of visit.  I discussed the limitations, risks, security and privacy concerns of performing an evaluation and management service by telephone and the availability of in person appointments. I also discussed with the patient that there may be a patient responsible charge related to this service. The patient expressed understanding and agreed to proceed.   History and Present Illness:  Sinusitis This is a new problem. Episode onset: 4-5 days. The problem is unchanged. There has been no fever. The fever has been present for 1 to 2 days. He is experiencing no pain. Associated symptoms include chills, congestion, coughing, headaches and a sore throat. Past treatments include acetaminophen. The treatment provided no relief.      Review of Systems  Constitutional:  Positive for chills.  HENT:  Positive for congestion and sore throat.   Eyes: Negative.   Respiratory:  Positive for cough.   Cardiovascular: Negative.   Skin: Negative.  Negative for itching and rash.  Neurological:  Positive for headaches.     Observations/Objective: Televisit patient not in distress  Assessment and Plan: Patient presents with symptoms of sinusitis, cough, and fever past 4 to 5 days..  OTC medication has not  helped to resolve patient's symptoms and he is getting worse.  Advised patient to Take meds as prescribed - Use a cool mist humidifier  -Use saline nose sprays frequently -Force fluids -For fever or aches or pains- take Tylenol or ibuprofen. -At home COVID-19 test negative.  Follow Up Instructions: Follow-up with worsening unresolved symptoms.    I discussed the assessment and treatment plan with the patient. The patient was provided an opportunity to ask questions and all were answered. The patient agreed with the plan and demonstrated an understanding of the instructions.   The patient was advised to call back or seek an in-person evaluation if the symptoms worsen or if the condition fails to improve as anticipated.  The above assessment and management plan was discussed with the patient. The patient verbalized understanding of and has agreed to the management plan. Patient is aware to call the clinic if symptoms persist or worsen. Patient is aware when to return to the clinic for a follow-up visit. Patient educated on when it is appropriate to go to the emergency department.   Time call ended: 1:01 PM  I provided 11 minutes of  non face-to-face time during this encounter.    Ivy Lynn, NP

## 2022-01-11 ENCOUNTER — Other Ambulatory Visit: Payer: Self-pay | Admitting: Family Medicine

## 2022-01-14 ENCOUNTER — Other Ambulatory Visit: Payer: Self-pay | Admitting: Family Medicine

## 2022-01-16 ENCOUNTER — Other Ambulatory Visit: Payer: Self-pay | Admitting: Family Medicine

## 2022-01-16 ENCOUNTER — Ambulatory Visit (INDEPENDENT_AMBULATORY_CARE_PROVIDER_SITE_OTHER): Payer: 59 | Admitting: Family Medicine

## 2022-01-16 ENCOUNTER — Encounter: Payer: Self-pay | Admitting: Family Medicine

## 2022-01-16 VITALS — BP 108/72 | HR 66 | Temp 97.0°F | Ht 72.0 in | Wt 217.6 lb

## 2022-01-16 DIAGNOSIS — E785 Hyperlipidemia, unspecified: Secondary | ICD-10-CM | POA: Diagnosis not present

## 2022-01-16 DIAGNOSIS — I10 Essential (primary) hypertension: Secondary | ICD-10-CM

## 2022-01-16 DIAGNOSIS — E119 Type 2 diabetes mellitus without complications: Secondary | ICD-10-CM | POA: Diagnosis not present

## 2022-01-16 LAB — CBC WITH DIFFERENTIAL/PLATELET
Basophils Absolute: 0.1 10*3/uL (ref 0.0–0.2)
Basos: 1 %
EOS (ABSOLUTE): 0.2 10*3/uL (ref 0.0–0.4)
Eos: 2 %
Hematocrit: 42.3 % (ref 37.5–51.0)
Hemoglobin: 14.2 g/dL (ref 13.0–17.7)
Immature Grans (Abs): 0 10*3/uL (ref 0.0–0.1)
Immature Granulocytes: 0 %
Lymphocytes Absolute: 3.7 10*3/uL — ABNORMAL HIGH (ref 0.7–3.1)
Lymphs: 38 %
MCH: 25.9 pg — ABNORMAL LOW (ref 26.6–33.0)
MCHC: 33.6 g/dL (ref 31.5–35.7)
MCV: 77 fL — ABNORMAL LOW (ref 79–97)
Monocytes Absolute: 0.8 10*3/uL (ref 0.1–0.9)
Monocytes: 9 %
Neutrophils Absolute: 4.8 10*3/uL (ref 1.4–7.0)
Neutrophils: 50 %
Platelets: 268 10*3/uL (ref 150–450)
RBC: 5.49 x10E6/uL (ref 4.14–5.80)
RDW: 12.8 % (ref 11.6–15.4)
WBC: 9.6 10*3/uL (ref 3.4–10.8)

## 2022-01-16 LAB — CMP14+EGFR
ALT: 33 IU/L (ref 0–44)
AST: 26 IU/L (ref 0–40)
Albumin/Globulin Ratio: 1.1 — ABNORMAL LOW (ref 1.2–2.2)
Albumin: 4.5 g/dL (ref 3.8–4.9)
Alkaline Phosphatase: 56 IU/L (ref 44–121)
BUN/Creatinine Ratio: 20 (ref 9–20)
BUN: 17 mg/dL (ref 6–24)
Bilirubin Total: 0.3 mg/dL (ref 0.0–1.2)
CO2: 23 mmol/L (ref 20–29)
Calcium: 9.6 mg/dL (ref 8.7–10.2)
Chloride: 100 mmol/L (ref 96–106)
Creatinine, Ser: 0.84 mg/dL (ref 0.76–1.27)
Globulin, Total: 4 g/dL (ref 1.5–4.5)
Glucose: 99 mg/dL (ref 70–99)
Potassium: 4.4 mmol/L (ref 3.5–5.2)
Sodium: 137 mmol/L (ref 134–144)
Total Protein: 8.5 g/dL (ref 6.0–8.5)
eGFR: 101 mL/min/{1.73_m2} (ref >59)

## 2022-01-16 LAB — LIPID PANEL
Chol/HDL Ratio: 3.5 ratio (ref 0.0–5.0)
Cholesterol, Total: 165 mg/dL (ref 100–199)
HDL: 47 mg/dL (ref >39)
LDL Chol Calc (NIH): 107 mg/dL — ABNORMAL HIGH (ref 0–99)
Triglycerides: 57 mg/dL (ref 0–149)
VLDL Cholesterol Cal: 11 mg/dL (ref 5–40)

## 2022-01-16 MED ORDER — ZINC GLUCONATE 50 MG PO TABS: 50.0000 mg | ORAL_TABLET | Freq: Every day | ORAL | 1 refills | Status: DC

## 2022-01-16 MED ORDER — TRULICITY 3 MG/0.5ML ~~LOC~~ SOAJ: 3.0000 mg | SUBCUTANEOUS | 1 refills | Status: DC

## 2022-01-16 NOTE — Progress Notes (Signed)
Subjective:  Patient ID: Kenneth Stone,  male    DOB: Jun 11, 1965  Age: 56 y.o.    CC: Medical Management of Chronic Issues   HPI Kenneth Stone presents for  follow-up of hypertension. Patient has no history of headache chest pain or shortness of breath or recent cough. Patient also denies symptoms of TIA such as numbness weakness lateralizing. Patient denies side effects from medication. States taking it regularly.  Patient also  in for follow-up of elevated cholesterol. Doing well without complaints on current medication. Denies side effects  including myalgia and arthralgia and nausea. Also in today for liver function testing. Currently no chest pain, shortness of breath or other cardiovascular related symptoms noted.  Follow-up of diabetes. Patient does check blood sugar at home. Readings run between 140 and 200.  Patient denies symptoms such as excessive hunger or urinary frequency, excessive hunger, nausea No significant hypoglycemic spells noted. Kenneth Stone says Kenneth Stone is eatings sugary foods Medications reviewed. Pt reports taking them regularly. Pt. denies complication/adverse reaction today.    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   Kenneth Stone has a past surgical history that includes Colonoscopy (N/A, 01/23/2017); Colonoscopy with propofol (N/A, 04/17/2021); Esophagogastroduodenoscopy (egd) with propofol (N/A, 04/17/2021); and biopsy (04/17/2021).   His family history includes Cancer in his mother; Heart disease in his father.Kenneth Stone reports that Kenneth Stone has never smoked. Kenneth Stone has never used smokeless tobacco. Kenneth Stone reports current alcohol use of about 2.0 standard drinks of alcohol per week. Kenneth Stone reports that Kenneth Stone does not use drugs.  Current Outpatient Medications on File Prior to Visit  Medication Sig  Dispense Refill   atorvastatin (LIPITOR) 20 MG tablet TAKE 1 TABLET BY MOUTH EVERY DAY 90 tablet 0   Blood Glucose Monitoring Suppl (ACCU-CHEK GUIDE) w/Device KIT Test BS BID Dx E11.9 1 kit 0   celecoxib (CELEBREX) 200 MG capsule Take 1 capsule (200 mg total) by mouth daily. With food, for arthritis 30 capsule 5   empagliflozin (JARDIANCE) 25 MG TABS tablet Take 1 tablet (25 mg total) by mouth daily. 90 tablet 3   esomeprazole (NEXIUM) 40 MG capsule Take 1 capsule (40 mg total) by mouth daily. 90 capsule 3   glipiZIDE (GLUCOTROL) 10 MG tablet Take 1 tablet (10 mg total) by mouth daily before breakfast. 90 tablet 3   glucose blood (ACCU-CHEK GUIDE) test strip Test BS BID Dx E11.9 200 each 3   guaiFENesin (MUCINEX) 600 MG 12 hr tablet Take 1 tablet (600 mg total) by mouth 2 (two) times daily. 30 tablet 0   Iron, Ferrous Sulfate, 325 (65 Fe) MG TABS Take 325 mg by mouth 2 (two) times daily. 180 tablet 3   ramipril (ALTACE) 10 MG capsule Take 1 capsule (10 mg total) by mouth daily. 90 capsule 3   No current facility-administered medications on file prior to visit.    ROS Review of Systems  Objective:  BP 108/72   Pulse 66   Temp (!) 97 F (36.1 C)   Ht 6' (1.829 m)   Wt 217 lb 9.6 oz (98.7 kg)   SpO2 98%   BMI 29.51 kg/m   BP Readings from Last 3 Encounters:  01/16/22 108/72  10/10/21 129/76  07/29/21 128/79    Wt Readings from Last 3 Encounters:  01/16/22 217 lb 9.6 oz (98.7 kg)  10/10/21 219 lb (99.3 kg)  07/29/21 221 lb (  100.2 kg)     Physical Exam Vitals reviewed.  Constitutional:      Appearance: Kenneth Stone is well-developed.  HENT:     Head: Normocephalic and atraumatic.     Right Ear: External ear normal.     Left Ear: External ear normal.     Mouth/Throat:     Pharynx: No oropharyngeal exudate or posterior oropharyngeal erythema.  Eyes:     Pupils: Pupils are equal, round, and reactive to light.  Cardiovascular:     Rate and Rhythm: Normal rate and regular rhythm.      Heart sounds: No murmur heard. Pulmonary:     Effort: No respiratory distress.     Breath sounds: Normal breath sounds.  Musculoskeletal:     Cervical back: Normal range of motion and neck supple.  Neurological:     Mental Status: Kenneth Stone is alert and oriented to person, place, and time.     Diabetic Foot Exam - Simple   No data filed     Lab Results  Component Value Date   HGBA1C 7.6 (H) 08/27/2021   HGBA1C 7.2 (H) 05/13/2021   HGBA1C 7.4 (H) 02/13/2021    Assessment & Plan:   Kenneth Stone was seen today for medical management of chronic issues.  Diagnoses and all orders for this visit:  Type 2 diabetes mellitus without complication, without long-term current use of insulin (HCC) -     Bayer DCA Hb A1c Waived -     Microalbumin / creatinine urine ratio  Essential hypertension -     CBC with Differential/Platelet -     CMP14+EGFR  Hyperlipidemia, unspecified hyperlipidemia type -     Lipid panel  Other orders -     Dulaglutide (TRULICITY) 3 FM/1.0AU SOPN; Inject 3 mg as directed once a week. -     zinc gluconate 50 MG tablet; Take 1 tablet (50 mg total) by mouth daily.   I have discontinued Kenneth Stone's amoxicillin-clavulanate, benzonatate, and Trulicity. I am also having him start on Trulicity and zinc gluconate. Additionally, I am having him maintain his empagliflozin, glipiZIDE, Iron (Ferrous Sulfate), celecoxib, Accu-Chek Guide, Accu-Chek Guide, esomeprazole, ramipril, guaiFENesin, and atorvastatin.  Meds ordered this encounter  Medications   Dulaglutide (TRULICITY) 3 EB/9.1LW SOPN    Sig: Inject 3 mg as directed once a week.    Dispense:  6 mL    Refill:  1   zinc gluconate 50 MG tablet    Sig: Take 1 tablet (50 mg total) by mouth daily.    Dispense:  90 tablet    Refill:  1   Discussed diabetic diet and avoidance of simple sugars today.  Follow-up: Return in about 3 months (around 04/17/2022).  Claretta Fraise, M.D.

## 2022-01-17 LAB — BAYER DCA HB A1C WAIVED: HB A1C (BAYER DCA - WAIVED): 8.2 % — ABNORMAL HIGH (ref 4.8–5.6)

## 2022-01-17 LAB — MICROALBUMIN / CREATININE URINE RATIO
Creatinine, Urine: 84.3 mg/dL
Microalb/Creat Ratio: 5 mg/g creat (ref 0–29)
Microalbumin, Urine: 4.5 ug/mL

## 2022-01-27 NOTE — Progress Notes (Signed)
Hello Ivis,  Your lab result is normal and/or stable.Some minor variations that are not significant are commonly marked abnormal, but do not represent any medical problem for you.  Best regards, Claretta Fraise, M.D.

## 2022-02-15 ENCOUNTER — Other Ambulatory Visit: Payer: Self-pay | Admitting: Family Medicine

## 2022-03-04 ENCOUNTER — Other Ambulatory Visit: Payer: Self-pay | Admitting: Family Medicine

## 2022-03-07 ENCOUNTER — Encounter: Payer: Self-pay | Admitting: Family Medicine

## 2022-03-07 ENCOUNTER — Ambulatory Visit (INDEPENDENT_AMBULATORY_CARE_PROVIDER_SITE_OTHER): Payer: 59 | Admitting: Family Medicine

## 2022-03-07 VITALS — BP 113/64 | HR 70 | Temp 96.8°F | Ht 72.0 in | Wt 219.1 lb

## 2022-03-07 DIAGNOSIS — L0100 Impetigo, unspecified: Secondary | ICD-10-CM | POA: Diagnosis not present

## 2022-03-07 DIAGNOSIS — H6593 Unspecified nonsuppurative otitis media, bilateral: Secondary | ICD-10-CM

## 2022-03-07 MED ORDER — MUPIROCIN 2 % EX OINT: 1.0000 | TOPICAL_OINTMENT | Freq: Two times a day (BID) | CUTANEOUS | 0 refills | Status: AC

## 2022-03-07 MED ORDER — LEVOCETIRIZINE DIHYDROCHLORIDE 5 MG PO TABS: 5.0000 mg | ORAL_TABLET | Freq: Every evening | ORAL | 0 refills | Status: DC

## 2022-03-07 NOTE — Progress Notes (Signed)
Acute Office Visit  Subjective:     Patient ID: Kenneth Stone, male    DOB: [birth year], 57 y.o.   MRN: [id number]  Chief Complaint  Patient presents with   Otalgia    Otalgia  There is pain in both ears. Episode onset: 5 days. The problem has been gradually improving. There has been no fever. Associated symptoms include rhinorrhea and a sore throat. Pertinent negatives include no abdominal pain, coughing, diarrhea, ear discharge, headaches, hearing loss, neck pain, rash or vomiting. Treatments tried: mucinex. The treatment provided mild relief. There is no history of a chronic ear infection, hearing loss or a tympanostomy tube.   He also reports crusting of the lower inner portion of his right nare for the last month. He has tried neosporin and vaseline without improvement.   Review of Systems  HENT:  Positive for ear pain, rhinorrhea and sore throat. Negative for ear discharge and hearing loss.   Respiratory:  Negative for cough.   Gastrointestinal:  Negative for abdominal pain, diarrhea and vomiting.  Musculoskeletal:  Negative for neck pain.  Skin:  Negative for rash.  Neurological:  Negative for headaches.        Objective:    BP 113/64   Pulse 70   Temp (!) 96.8 F (36 C) (Temporal)   Ht 6' (1.829 m)   Wt 219 lb 2 oz (99.4 kg)   SpO2 97%   BMI 29.72 kg/m    Physical Exam Vitals and nursing note reviewed.  Constitutional:      General: He is not in acute distress.    Appearance: He is not ill-appearing, toxic-appearing or diaphoretic.  HENT:     Right Ear: Ear canal and external ear normal. A middle ear effusion is present. No mastoid tenderness. Tympanic membrane is not scarred, perforated, erythematous, retracted or bulging.     Left Ear: Ear canal and external ear normal. A middle ear effusion is present. No mastoid tenderness. Tympanic membrane is not scarred, perforated, erythematous, retracted or bulging.     Nose:     Comments: Impetigo present of right  nare    Mouth/Throat:     Mouth: Mucous membranes are moist.     Pharynx: Oropharynx is clear. No oropharyngeal exudate or posterior oropharyngeal erythema.  Eyes:     General:        Right eye: No discharge.        Left eye: No discharge.     Conjunctiva/sclera: Conjunctivae normal.  Pulmonary:     Effort: Pulmonary effort is normal. No respiratory distress.  Skin:    General: Skin is warm and dry.  Neurological:     General: No focal deficit present.     Mental Status: He is alert and oriented to person, place, and time.  Psychiatric:        Mood and Affect: Mood normal.        Behavior: Behavior normal.     No results found for any visits on 03/07/22.      Assessment & Plan:   Kenneth Stone was seen today for otalgia.  Diagnoses and all orders for this visit:  Fluid level behind tympanic membrane of both ears Xyzal as below. OTC decongestant.  -     levocetirizine (XYZAL) 5 MG tablet; Take 1 tablet (5 mg total) by mouth every evening.  Impetigo Bactroban as below.  -     mupirocin ointment (BACTROBAN) 2 %; Apply 1 Application topically 2 (two) times daily for 7  days.   Return if symptoms worsen or fail to improve.  The patient indicates understanding of these issues and agrees with the plan.   Gwenlyn Perking, FNP

## 2022-03-07 NOTE — Patient Instructions (Signed)
Otitis Media With Effusion, Adult  Otitis media with effusion (OME) is inflammation and fluid (effusion) in the middle ear without having an ear infection. The middle ear is the space behind the eardrum. The middle ear is connected to the back of the throat by a narrow tube (eustachian tube). Normally the eustachian tube drains fluid out of the middle ear. A swollen eustachian tube can become blocked and cause fluid to collect in the middle ear. OME often goes away without treatment. Sometimes OME can lead to hearing problems and recurrent acute ear infections (acute otitis media). These conditions may require treatment. What are the causes? OME is caused by a blocked eustachian tube. This can result from: Allergies. Upper respiratory infections. Enlarged adenoids. The adenoids are areas of soft tissue located high in the back of the throat, behind the nose and the roof of the mouth. They are part of the body's natural defense system (immune system). Rapid changes in pressure, like when an airplane is descending or during scuba diving. In some cases, the cause of this condition is not known. What are the signs or symptoms? Common symptoms of this condition include: A feeling of fullness in your ear. Decreased hearing in the affected ear. Fluid draining into the ear canal. Pain in the ear. In some cases, there are no symptoms. How is this diagnosed?  A health care provider can diagnose OME based on signs and symptoms of the condition. Your provider will also do a physical exam to check for fluid behind the eardrum. During the exam, your health care provider will use an instrument called an otoscope to look in your ear. Your health care provider may do other tests, such as: A hearing test. A tympanogram. This is a test that shows how well the eardrum moves in response to air pressure in the ear canal. It provides a graph for your health care provider to review. A pneumatic otoscopy. This is a  test to check how your eardrum moves in response to changes in pressure. It is done by squeezing a small amount of air into the ear. How is this treated? Treatment for OME depends on the cause of the condition and the severity of symptoms. The first step is often waiting to see if the fluid drains on its own in a few weeks. Home care treatment may include: Over-the-counter pain relievers. A warm, moist cloth placed over the ear. Severe cases may require a procedure to insert tubes in the ears (tympanostomy tubes) to drain the fluid. Follow these instructions at home: Take over-the-counter and prescription medicines only as told by your health care provider. Keep all follow-up visits. Contact a health care provider if: You have pain that gets worse. Hearing in your affected ear gets worse. You have fluid draining from your ear canal. You have dizziness. You develop a fever. Get help right away if: You develop a severe headache. You completely lose hearing in the affected ear. You have bleeding from your ear canal. You have sudden and severe pain in your ear. These symptoms may represent a serious problem that is an emergency. Do not wait to see if the symptoms will go away. Get medical help right away. Call your local emergency services (911 in the [place] Do not drive yourself to the hospital. Summary Otitis media with effusion (OME) is inflammation and fluid (effusion) in the middle ear without having an ear infection. A swollen eustachian tube can become blocked and cause fluid to collect in the  middle ear. Treatment for OME depends on the cause of the condition and the severity of symptoms. Many times, treatment is not needed because the fluid drains on its own in a few weeks. Sometimes OME can lead to hearing problems and recurrent acute ear infections (acute otitis media), which may require treatment. This information is not intended to replace advice given to you by your health care  provider. Make sure you discuss any questions you have with your health care provider. Document Revised: 05/10/2020 Document Reviewed: 05/10/2020 Elsevier Patient Education  [place].

## 2022-03-27 ENCOUNTER — Other Ambulatory Visit: Payer: Self-pay | Admitting: Family Medicine

## 2022-03-27 DIAGNOSIS — H6593 Unspecified nonsuppurative otitis media, bilateral: Secondary | ICD-10-CM

## 2022-03-31 ENCOUNTER — Telehealth: Payer: Self-pay | Admitting: Family Medicine

## 2022-03-31 ENCOUNTER — Ambulatory Visit (INDEPENDENT_AMBULATORY_CARE_PROVIDER_SITE_OTHER): Payer: 59 | Admitting: Family Medicine

## 2022-03-31 ENCOUNTER — Other Ambulatory Visit: Payer: Self-pay | Admitting: Family Medicine

## 2022-03-31 ENCOUNTER — Encounter: Payer: Self-pay | Admitting: Family Medicine

## 2022-03-31 VITALS — BP 138/79 | HR 68 | Temp 97.5°F | Ht 72.0 in | Wt 218.5 lb

## 2022-03-31 DIAGNOSIS — K219 Gastro-esophageal reflux disease without esophagitis: Secondary | ICD-10-CM

## 2022-03-31 DIAGNOSIS — H6593 Unspecified nonsuppurative otitis media, bilateral: Secondary | ICD-10-CM | POA: Diagnosis not present

## 2022-03-31 DIAGNOSIS — K5900 Constipation, unspecified: Secondary | ICD-10-CM | POA: Diagnosis not present

## 2022-03-31 MED ORDER — PANTOPRAZOLE SODIUM 40 MG PO TBEC: 40.0000 mg | DELAYED_RELEASE_TABLET | Freq: Every day | ORAL | 3 refills | Status: DC

## 2022-03-31 NOTE — Patient Instructions (Signed)
Simethicone (Gas X) for gas.   Miralax twice a day until good daily bowel movement. Add Dulcolax as needed.

## 2022-03-31 NOTE — Telephone Encounter (Signed)
Patient called stating that he spoke with the pharmacy and was told that his insurance would cover Protonix but requires PA.   Doesn't look like [facility] sent Protonix in for him but that is what patient is requesting. Can Tiffany advise on this?

## 2022-03-31 NOTE — Progress Notes (Signed)
Acute Office Visit  Subjective:     Patient ID: Kenneth Stone, male    DOB: [birth year], 57 y.o.   MRN: [id number]  Chief Complaint  Patient presents with   Otalgia    HPI Khyrie is here for right ear pain. He reports his right ear hurts when having a BM the last few days. He has had to strain to have a small BM since the weekend. His ear does not hurt otherwise. He has tried miralax without improvement. He also reports increase flatus and heartburn. He has epigastric abdominal pain. He currently takes Nexium for GERD without good control. He was previously on protonix with good control.   Review of Systems  Constitutional:  Negative for chills, fever and weight loss.  HENT:  Positive for ear pain. Negative for congestion, ear discharge, hearing loss, sinus pain, sore throat and tinnitus.   Respiratory:  Negative for cough and shortness of breath.   Cardiovascular:  Negative for chest pain.  Gastrointestinal:  Positive for abdominal pain, constipation and heartburn. Negative for blood in stool, diarrhea, melena, nausea and vomiting.  Neurological:  Negative for dizziness and headaches.        Objective:    BP 138/79   Pulse 68   Temp (!) 97.5 F (36.4 C) (Temporal)   Ht 6' (1.829 m)   Wt 218 lb 8 oz (99.1 kg)   SpO2 98%   BMI 29.63 kg/m    Physical Exam Vitals and nursing note reviewed.  Constitutional:      General: He is not in acute distress.    Appearance: He is not ill-appearing, toxic-appearing or diaphoretic.  HENT:     Right Ear: Ear canal and external ear normal. A middle ear effusion is present. Tympanic membrane is not scarred, perforated, erythematous, retracted or bulging.     Left Ear: Ear canal and external ear normal. A middle ear effusion is present. Tympanic membrane is not scarred, perforated, erythematous, retracted or bulging.  Cardiovascular:     Rate and Rhythm: Normal rate and regular rhythm.     Heart sounds: Normal heart sounds. No murmur  heard. Pulmonary:     Effort: Pulmonary effort is normal.     Breath sounds: Normal breath sounds.  Abdominal:     General: Bowel sounds are normal. There is no distension.     Palpations: Abdomen is soft.     Tenderness: There is no abdominal tenderness. There is no guarding or rebound.  Musculoskeletal:     Cervical back: Neck supple. No rigidity.  Skin:    General: Skin is warm and dry.  Neurological:     General: No focal deficit present.     Mental Status: He is alert and oriented to person, place, and time.  Psychiatric:        Mood and Affect: Mood normal.        Behavior: Behavior normal.     No results found for any visits on 03/31/22.      Assessment & Plan:   Coolidge was seen today for otalgia.  Diagnoses and all orders for this visit:  Constipation, unspecified constipation type Discussed miralax regimen with dulcolax prn. Fiber, water. Return to office for new or worsening symptoms, or if symptoms persist.   Gastroesophageal reflux disease without esophagitis Uncontrolled. Will switch from nexium to protonix. Reports previously well controlled with protonix.  -     pantoprazole (PROTONIX) 40 MG tablet; Take 1 tablet (40 mg total) by mouth daily.  Fluid level behind tympanic membrane of both ears No signs of infection. Antihistamines and decongestants.    Return if symptoms worsen or fail to improve.  The patient indicates understanding of these issues and agrees with the plan.  Gwenlyn Perking, FNP

## 2022-04-02 ENCOUNTER — Ambulatory Visit (INDEPENDENT_AMBULATORY_CARE_PROVIDER_SITE_OTHER): Payer: 59 | Admitting: Family Medicine

## 2022-04-02 ENCOUNTER — Encounter: Payer: Self-pay | Admitting: Family Medicine

## 2022-04-02 DIAGNOSIS — K219 Gastro-esophageal reflux disease without esophagitis: Secondary | ICD-10-CM

## 2022-04-02 MED ORDER — MAGNESIUM CITRATE PO SOLN: 1.0000 | Freq: Once | ORAL | 2 refills | Status: DC

## 2022-04-02 MED ORDER — ZOLPIDEM TARTRATE 5 MG PO TABS: 5.0000 mg | ORAL_TABLET | Freq: Every evening | ORAL | 0 refills | Status: DC | PRN

## 2022-04-02 MED ORDER — PANTOPRAZOLE SODIUM 40 MG PO TBEC: 40.0000 mg | DELAYED_RELEASE_TABLET | Freq: Every day | ORAL | 3 refills | Status: DC

## 2022-04-02 MED ORDER — AZITHROMYCIN 250 MG PO TABS: ORAL_TABLET | ORAL | 0 refills | Status: DC

## 2022-04-02 NOTE — Progress Notes (Signed)
Subjective:  Patient ID: Kenneth Stone, male    DOB: 14-Jan-1966  Age: 57 y.o. MRN: [id number]  CC: Back Pain   HPI Kenneth Stone presents for Patient in for follow-up of GERD. Currently asymptomatic taking  PPI daily. There is no chest pain or heartburn. No hematemesis and no melena. No dysphagia or choking. Onset is remote. Progression is stable. Complicating factors, none.  He continues to have constipation leading to abd pain. No results from previous treatment      04/02/2022   10:36 AM 03/31/2022    2:01 PM 03/07/2022    9:40 AM  Depression screen PHQ 2/9  Decreased Interest 0 0 0  Down, Depressed, Hopeless 0 0 0  PHQ - 2 Score 0 0 0  Altered sleeping  0 0  Tired, decreased energy  0 0  Change in appetite  0 0  Feeling bad or failure about yourself   0 0  Trouble concentrating  0 0  Moving slowly or fidgety/restless  0 0  Suicidal thoughts  0 0  PHQ-9 Score  0 0  Difficult doing work/chores  Not difficult at all Not difficult at all    History Kenneth Stone has a past medical history of Diabetes mellitus without complication ([place]), Essential hypertension (09/05/2014), GERD (gastroesophageal reflux disease), Hyperlipidemia (12/31/2014), and Spondylosis, cervical, 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   He has a past surgical history that includes Colonoscopy (N/A, 01/23/2017); Colonoscopy with propofol (N/A, 04/17/2021); Esophagogastroduodenoscopy (egd) with propofol (N/A, 04/17/2021); and biopsy (04/17/2021).   His family history includes Cancer in his mother; Heart disease in his father.He reports that he has never smoked. He has never used smokeless tobacco. He reports current alcohol use of about 2.0 standard drinks of alcohol per week. He reports that he does not use drugs.    ROS Review of Systems  Constitutional:  Negative for fever.  Respiratory:  Negative for shortness of breath.   Cardiovascular:  Negative for chest pain.  Musculoskeletal:  Negative for arthralgias.  Skin:  Negative  for rash.    Objective:  BP 115/75   Pulse 78   Temp 97.6 F (36.4 C)   Ht 6' (1.829 m)   Wt 217 lb 12.8 oz (98.8 kg)   SpO2 98%   BMI 29.54 kg/m   BP Readings from Last 3 Encounters:  04/02/22 115/75  03/31/22 138/79  03/07/22 113/64    Wt Readings from Last 3 Encounters:  04/02/22 217 lb 12.8 oz (98.8 kg)  03/31/22 218 lb 8 oz (99.1 kg)  03/07/22 219 lb 2 oz (99.4 kg)     Physical Exam Vitals reviewed.  Constitutional:      Appearance: He is well-developed.  HENT:     Head: Normocephalic and atraumatic.     Right Ear: External ear normal.     Left Ear: External ear normal.     Mouth/Throat:     Pharynx: No oropharyngeal exudate or posterior oropharyngeal erythema.  Eyes:     Pupils: Pupils are equal, round, and reactive to light.  Cardiovascular:     Rate and Rhythm: Normal rate and regular rhythm.     Heart sounds: No murmur heard. Pulmonary:     Effort: No respiratory distress.     Breath sounds: Normal breath sounds.  Abdominal:     General: There is distension.     Tenderness: There is abdominal tenderness (diffuse, mild to moderate).  Musculoskeletal:     Cervical back: Normal range of motion and neck supple.  Neurological:     Mental Status: He is alert and oriented to person, place, and time.       Assessment & Plan:   Kenneth Stone was seen today for back pain.  Diagnoses and all orders for this visit:  Gastroesophageal reflux disease without esophagitis -     pantoprazole (PROTONIX) 40 MG tablet; Take 1 tablet (40 mg total) by mouth daily.  Other orders -     magnesium citrate SOLN; Take 296 mLs (1 Bottle total) by mouth once for 1 dose. Daily as needed for constipation -     zolpidem (AMBIEN) 5 MG tablet; Take 1 tablet (5 mg total) by mouth at bedtime as needed for sleep. -     azithromycin (ZITHROMAX Z-PAK) 250 MG tablet; Take two right away Then one a day for the next 4 days.       I am having Kenneth Stone start on magnesium citrate,  zolpidem, and azithromycin. I am also having him maintain his Iron (Ferrous Sulfate), celecoxib, Accu-Chek Guide, Accu-Chek Guide, ramipril, guaiFENesin, atorvastatin, Trulicity, zinc gluconate, glipiZIDE, Jardiance, levocetirizine, and pantoprazole.  Allergies as of 04/02/2022   No Known Allergies      Medication List        Accurate as of April 02, 2022  9:41 PM. If you have any questions, ask your nurse or doctor.          Accu-Chek Guide test strip Generic drug: glucose blood Test BS BID Dx E11.9   Accu-Chek Guide w/Device Kit Test BS BID Dx E11.9   atorvastatin 20 MG tablet Commonly known as: LIPITOR TAKE 1 TABLET BY MOUTH EVERY DAY   azithromycin 250 MG tablet Commonly known as: Zithromax Z-Pak Take two right away Then one a day for the next 4 days. Started by: Claretta Fraise, MD   celecoxib 200 MG capsule Commonly known as: CeleBREX Take 1 capsule (200 mg total) by mouth daily. With food, for arthritis   glipiZIDE 10 MG tablet Commonly known as: GLUCOTROL TAKE 1 TABLET BY MOUTH DAILY BEFORE BREAKFAST.   guaiFENesin 600 MG 12 hr tablet Commonly known as: Mucinex Take 1 tablet (600 mg total) by mouth 2 (two) times daily.   Iron (Ferrous Sulfate) 325 (65 Fe) MG Tabs Take 325 mg by mouth 2 (two) times daily.   Jardiance 25 MG Tabs tablet Generic drug: empagliflozin TAKE 1 TABLET (25 MG TOTAL) BY MOUTH DAILY.   levocetirizine 5 MG tablet Commonly known as: XYZAL TAKE 1 TABLET BY MOUTH EVERY DAY IN THE EVENING   magnesium citrate Soln Take 296 mLs (1 Bottle total) by mouth once for 1 dose. Daily as needed for constipation Started by: Claretta Fraise, MD   pantoprazole 40 MG tablet Commonly known as: PROTONIX Take 1 tablet (40 mg total) by mouth daily.   ramipril 10 MG capsule Commonly known as: ALTACE Take 1 capsule (10 mg total) by mouth daily.   Trulicity 3 0000000 Sopn Generic drug: Dulaglutide Inject 3 mg as directed once a week.   zinc  gluconate 50 MG tablet Take 1 tablet (50 mg total) by mouth daily.   zolpidem 5 MG tablet Commonly known as: AMBIEN Take 1 tablet (5 mg total) by mouth at bedtime as needed for sleep. Started by: Claretta Fraise, MD         Follow-up: Return in about 3 months (around 07/03/2022).  Claretta Fraise, M.D.

## 2022-04-03 ENCOUNTER — Other Ambulatory Visit (HOSPITAL_COMMUNITY): Payer: Self-pay

## 2022-04-03 NOTE — Telephone Encounter (Signed)
Patient Advocate Encounter  Prior authorization is required for Pantoprazole Sodium '40MG'$  dr tablets. PA submitted and APPROVED on 04/03/22.  Key [id number] Effective: 04/03/22 - 04/03/23

## 2022-04-15 ENCOUNTER — Other Ambulatory Visit: Payer: Self-pay | Admitting: Family Medicine

## 2022-04-20 ENCOUNTER — Other Ambulatory Visit: Payer: Self-pay | Admitting: Family Medicine

## 2022-04-21 ENCOUNTER — Encounter: Payer: Self-pay | Admitting: Family Medicine

## 2022-04-21 ENCOUNTER — Ambulatory Visit (INDEPENDENT_AMBULATORY_CARE_PROVIDER_SITE_OTHER): Payer: 59 | Admitting: Family Medicine

## 2022-04-21 VITALS — BP 112/71 | HR 65 | Temp 97.1°F | Ht 72.0 in | Wt 218.4 lb

## 2022-04-21 DIAGNOSIS — E119 Type 2 diabetes mellitus without complications: Secondary | ICD-10-CM | POA: Diagnosis not present

## 2022-04-21 DIAGNOSIS — Z7985 Long-term (current) use of injectable non-insulin antidiabetic drugs: Secondary | ICD-10-CM | POA: Diagnosis not present

## 2022-04-21 DIAGNOSIS — S43422A Sprain of left rotator cuff capsule, initial encounter: Secondary | ICD-10-CM | POA: Diagnosis not present

## 2022-04-21 DIAGNOSIS — K219 Gastro-esophageal reflux disease without esophagitis: Secondary | ICD-10-CM

## 2022-04-21 DIAGNOSIS — E756 Lipid storage disorder, unspecified: Secondary | ICD-10-CM | POA: Diagnosis not present

## 2022-04-21 DIAGNOSIS — E1169 Type 2 diabetes mellitus with other specified complication: Secondary | ICD-10-CM | POA: Diagnosis not present

## 2022-04-21 DIAGNOSIS — E785 Hyperlipidemia, unspecified: Secondary | ICD-10-CM | POA: Diagnosis not present

## 2022-04-21 DIAGNOSIS — I10 Essential (primary) hypertension: Secondary | ICD-10-CM | POA: Diagnosis not present

## 2022-04-21 LAB — LIPID PANEL

## 2022-04-21 LAB — BAYER DCA HB A1C WAIVED: HB A1C (BAYER DCA - WAIVED): 7.1 % — ABNORMAL HIGH (ref 4.8–5.6)

## 2022-04-21 NOTE — Progress Notes (Signed)
Subjective:  Patient ID: Kenneth Stone,  male    DOB: 08-Sep-1965  Age: 57 y.o.    CC: Medical Management of Chronic Issues   HPI Abdulrahman Riggan presents for  follow-up of hypertension. Patient has no history of headache chest pain or shortness of breath or recent cough. Patient also denies symptoms of TIA such as numbness weakness lateralizing. Patient denies side effects from medication. States taking it regularly.  Patient also  in for follow-up of elevated cholesterol. Doing well without complaints on current medication. Denies side effects  including myalgia and arthralgia and nausea. Also in today for liver function testing. Currently no chest pain, shortness of breath or other cardiovascular related symptoms noted.  Follow-up of diabetes. Patient does check blood sugar at home. Readings run between 140 and 150 Patient denies symptoms such as excessive hunger or urinary frequency, excessive hunger, nausea No significant hypoglycemic spells noted. Medications reviewed. Can't afford Jardiance.   Patient in for follow-up of GERD. Currently asymptomatic taking  PPI daily. There is no chest pain or heartburn. No hematemesis and no melena. No dysphagia or choking. Onset is remote. Progression is stable. Complicating factors, none.    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   He has a past surgical history that includes Colonoscopy (N/A, 01/23/2017); Colonoscopy with propofol (N/A, 04/17/2021); Esophagogastroduodenoscopy (egd) with propofol (N/A, 04/17/2021); and biopsy (04/17/2021).   His family history includes Cancer in his mother; Heart disease in his father.He reports that he has never smoked. He has never used smokeless tobacco. He reports current alcohol use of about 2.0 standard  drinks of alcohol per week. He reports that he does not use drugs.  Current Outpatient Medications on File Prior to Visit  Medication Sig Dispense Refill   atorvastatin (LIPITOR) 20 MG tablet TAKE 1 TABLET BY MOUTH EVERY DAY 30 tablet 2   Blood Glucose Monitoring Suppl (ACCU-CHEK GUIDE) w/Device KIT Test BS BID Dx E11.9 1 kit 0   celecoxib (CELEBREX) 200 MG capsule Take 1 capsule (200 mg total) by mouth daily. With food, for arthritis 30 capsule 5   Dulaglutide (TRULICITY) 3 0000000 SOPN Inject 3 mg as directed once a week. 6 mL 1   ferrous sulfate ([facility] IRON) 325 (65 FE) MG tablet TAKE 1 TABLET BY MOUTH 2 TIMES DAILY. 180 tablet 0   glipiZIDE (GLUCOTROL) 10 MG tablet TAKE 1 TABLET BY MOUTH DAILY BEFORE BREAKFAST. 30 tablet 11   glucose blood (ACCU-CHEK GUIDE) test strip Test BS BID Dx E11.9 200 each 3   guaiFENesin (MUCINEX) 600 MG 12 hr tablet Take 1 tablet (600 mg total) by mouth 2 (two) times daily. 30 tablet 0   JARDIANCE 25 MG TABS tablet TAKE 1 TABLET (25 MG TOTAL) BY MOUTH DAILY. 30 tablet 11   levocetirizine (XYZAL) 5 MG tablet TAKE 1 TABLET BY MOUTH EVERY DAY IN THE EVENING 90 tablet 1   pantoprazole (PROTONIX) 40 MG tablet Take 1 tablet (40 mg total) by mouth daily. 90 tablet 3   ramipril (ALTACE) 10 MG capsule Take 1 capsule (10 mg total) by mouth daily. 90 capsule 3   zinc gluconate 50 MG tablet Take 1 tablet (50 mg total) by mouth daily. 90 tablet 1   zolpidem (AMBIEN) 5 MG tablet Take 1 tablet (5 mg total) by mouth at bedtime as needed for sleep. 7 tablet 0  No current facility-administered medications on file prior to visit.    ROS Review of Systems  Constitutional:  Negative for fever.  Respiratory:  Negative for shortness of breath.   Cardiovascular:  Negative for chest pain.  Musculoskeletal:  Positive for arthralgias (left shoulder pain. NKI).  Skin:  Negative for rash.    Objective:  BP 112/71   Pulse 65   Temp (!) 97.1 F (36.2 C)   Ht 6' (1.829 m)   Wt 218  lb 6.4 oz (99.1 kg)   SpO2 100%   BMI 29.62 kg/m   BP Readings from Last 3 Encounters:  04/21/22 112/71  04/02/22 115/75  03/31/22 138/79    Wt Readings from Last 3 Encounters:  04/21/22 218 lb 6.4 oz (99.1 kg)  04/02/22 217 lb 12.8 oz (98.8 kg)  03/31/22 218 lb 8 oz (99.1 kg)     Physical Exam Vitals reviewed.  Constitutional:      Appearance: He is well-developed.  HENT:     Head: Normocephalic and atraumatic.     Right Ear: External ear normal.     Left Ear: External ear normal.     Mouth/Throat:     Pharynx: No oropharyngeal exudate or posterior oropharyngeal erythema.  Eyes:     Pupils: Pupils are equal, round, and reactive to light.  Cardiovascular:     Rate and Rhythm: Normal rate and regular rhythm.     Heart sounds: No murmur heard. Pulmonary:     Effort: No respiratory distress.     Breath sounds: Normal breath sounds.  Musculoskeletal:        General: Tenderness (left deltoid. Painful for resisted abduction, ext. rotation) present.     Cervical back: Normal range of motion and neck supple.  Neurological:     Mental Status: He is alert and oriented to person, place, and time.     Diabetic Foot Exam - Simple   No data filed     Lab Results  Component Value Date   HGBA1C 8.2 (H) 01/16/2022   HGBA1C 7.6 (H) 08/27/2021   HGBA1C 7.2 (H) 05/13/2021    Assessment & Plan:   Hashim was seen today for medical management of chronic issues.  Diagnoses and all orders for this visit:  Diabetic lipidosis ([place]) -     Bayer DCA Hb A1c Waived  Essential hypertension -     CBC with Differential/Platelet -     CMP14+EGFR  Hyperlipidemia, unspecified hyperlipidemia type -     Lipid panel  Gastroesophageal reflux disease without esophagitis  Sprain of left rotator cuff capsule, initial encounter   I have discontinued Caellum Roebuck's azithromycin. I am also having him maintain his celecoxib, Accu-Chek Guide, Accu-Chek Guide, ramipril, guaiFENesin,  Trulicity, zinc gluconate, glipiZIDE, Jardiance, levocetirizine, pantoprazole, zolpidem, atorvastatin, and [facility] Iron.  Rest shoulder. Use Heat.  Take tylenol or Ibuprofen prn   Follow-up: Return in about 3 months (around 07/22/2022).  Claretta Fraise, M.D.

## 2022-04-22 LAB — CBC WITH DIFFERENTIAL/PLATELET
Basophils Absolute: 0.1 10*3/uL (ref 0.0–0.2)
Basos: 1 %
EOS (ABSOLUTE): 0.5 10*3/uL — ABNORMAL HIGH (ref 0.0–0.4)
Eos: 6 %
Hematocrit: 43.7 % (ref 37.5–51.0)
Hemoglobin: 14.7 g/dL (ref 13.0–17.7)
Immature Grans (Abs): 0 10*3/uL (ref 0.0–0.1)
Immature Granulocytes: 0 %
Lymphocytes Absolute: 3.1 10*3/uL (ref 0.7–3.1)
Lymphs: 39 %
MCH: 26.8 pg (ref 26.6–33.0)
MCHC: 33.6 g/dL (ref 31.5–35.7)
MCV: 80 fL (ref 79–97)
Monocytes Absolute: 0.8 10*3/uL (ref 0.1–0.9)
Monocytes: 10 %
Neutrophils Absolute: 3.6 10*3/uL (ref 1.4–7.0)
Neutrophils: 44 %
Platelets: 253 10*3/uL (ref 150–450)
RBC: 5.49 x10E6/uL (ref 4.14–5.80)
RDW: 14 % (ref 11.6–15.4)
WBC: 8.1 10*3/uL (ref 3.4–10.8)

## 2022-04-22 LAB — LIPID PANEL
Chol/HDL Ratio: 3.3 ratio (ref 0.0–5.0)
Cholesterol, Total: 152 mg/dL (ref 100–199)
HDL: 46 mg/dL (ref >39)
LDL Chol Calc (NIH): 93 mg/dL (ref 0–99)
Triglycerides: 65 mg/dL (ref 0–149)
VLDL Cholesterol Cal: 13 mg/dL (ref 5–40)

## 2022-04-22 LAB — CMP14+EGFR
ALT: 29 IU/L (ref 0–44)
AST: 32 IU/L (ref 0–40)
Albumin/Globulin Ratio: 1.2 (ref 1.2–2.2)
Albumin: 4.6 g/dL (ref 3.8–4.9)
Alkaline Phosphatase: 51 IU/L (ref 44–121)
BUN/Creatinine Ratio: 18 (ref 9–20)
BUN: 13 mg/dL (ref 6–24)
Bilirubin Total: 0.4 mg/dL (ref 0.0–1.2)
CO2: 23 mmol/L (ref 20–29)
Calcium: 9.8 mg/dL (ref 8.7–10.2)
Chloride: 100 mmol/L (ref 96–106)
Creatinine, Ser: 0.74 mg/dL — ABNORMAL LOW (ref 0.76–1.27)
Globulin, Total: 3.7 g/dL (ref 1.5–4.5)
Glucose: 103 mg/dL — ABNORMAL HIGH (ref 70–99)
Potassium: 4.4 mmol/L (ref 3.5–5.2)
Sodium: 138 mmol/L (ref 134–144)
Total Protein: 8.3 g/dL (ref 6.0–8.5)
eGFR: 105 mL/min/{1.73_m2} (ref >59)

## 2022-04-22 NOTE — Progress Notes (Signed)
Hello Dominie,  Your lab result is normal and/or stable.Some minor variations that are not significant are commonly marked abnormal, but do not represent any medical problem for you.  Best regards, Dewel Lotter, M.D.

## 2022-04-23 ENCOUNTER — Other Ambulatory Visit: Payer: Self-pay | Admitting: Family Medicine

## 2022-05-08 ENCOUNTER — Telehealth: Payer: Self-pay | Admitting: Family Medicine

## 2022-05-08 ENCOUNTER — Other Ambulatory Visit (HOSPITAL_COMMUNITY): Payer: Self-pay

## 2022-05-08 ENCOUNTER — Encounter: Payer: Self-pay | Admitting: Family Medicine

## 2022-05-08 ENCOUNTER — Ambulatory Visit (INDEPENDENT_AMBULATORY_CARE_PROVIDER_SITE_OTHER): Payer: 59 | Admitting: Family Medicine

## 2022-05-08 ENCOUNTER — Ambulatory Visit: Payer: 59 | Admitting: Family Medicine

## 2022-05-08 VITALS — BP 117/69 | HR 81 | Temp 96.3°F | Ht 73.0 in | Wt 218.6 lb

## 2022-05-08 DIAGNOSIS — M4712 Other spondylosis with myelopathy, cervical region: Secondary | ICD-10-CM | POA: Diagnosis not present

## 2022-05-08 DIAGNOSIS — J301 Allergic rhinitis due to pollen: Secondary | ICD-10-CM

## 2022-05-08 MED ORDER — METHYLPREDNISOLONE ACETATE 40 MG/ML IJ SUSP
40.0000 mg | Freq: Once | INTRAMUSCULAR | Status: AC
  Administered 2022-05-08: 40 mg via INTRAMUSCULAR

## 2022-05-08 MED ORDER — CELECOXIB 200 MG PO CAPS: 200.0000 mg | ORAL_CAPSULE | Freq: Every day | ORAL | 0 refills | Status: DC

## 2022-05-08 MED ORDER — FLUTICASONE PROPIONATE 50 MCG/ACT NA SUSP: 2.0000 | Freq: Every day | NASAL | 6 refills | Status: DC

## 2022-05-08 NOTE — Addendum Note (Signed)
Addended by: Angela Adam on: 05/08/2022 09:47 AM   Modules accepted: Orders

## 2022-05-08 NOTE — Progress Notes (Signed)
Subjective:  Patient ID: Kenneth Stone, male    DOB: 1965/04/16, 57 y.o.   MRN: [id number]  Patient Care Team: Mechele Claude, MD as PCP - General (Family Medicine) Marguerita Merles, Reuel Boom, MD as Consulting Physician (Gastroenterology)   Chief Complaint:  Neck Pain (Neck pain x 3 days )   HPI: Kenneth Stone is a 57 y.o. male presenting on 05/08/2022 for Neck Pain (Neck pain x 3 days )   1. Spondylosis, cervical, with myelopathy Pt reports ongoing neck pain and stiffness. He ran out of his Celebrex. States he felt this was slightly beneficial. He denies new injures. No loss of function. States he has pain when he tries to turn his head or brush his hair, more on left than right. Does radiate to arms at times. No other associated symptoms.   2. Seasonal allergic rhinitis due to pollen Reports rhinorrhea and congestion over the last several days. Has been taking an antihistamine without relief of symptoms. No fever, chills, sputum production, or shortness of breath.     Relevant past medical, surgical, family, and social history reviewed and updated as indicated.  Allergies and medications reviewed and updated. Data reviewed: Chart in Epic.   Past Medical History:  Diagnosis Date   Diabetes mellitus without complication    Essential hypertension 09/05/2014   GERD (gastroesophageal reflux disease)    Hyperlipidemia 12/31/2014   Spondylosis, cervical, with myelopathy 02/06/2016    Past Surgical History:  Procedure Laterality Date   BIOPSY  04/17/2021   Procedure: BIOPSY;  Surgeon: Dolores Frame, MD;  Location: [facility];  Service: Gastroenterology;;   COLONOSCOPY N/A 01/23/2017   Procedure: COLONOSCOPY;  Surgeon: Malissa Hippo, MD;  Location: [facility];  Service: Endoscopy;  Laterality: N/A;  1:00   COLONOSCOPY WITH PROPOFOL N/A 04/17/2021   Procedure: COLONOSCOPY WITH PROPOFOL;  Surgeon: Dolores Frame, MD;  Location: [facility];  Service:  Gastroenterology;  Laterality: N/A;  200   ESOPHAGOGASTRODUODENOSCOPY (EGD) WITH PROPOFOL N/A 04/17/2021   Procedure: ESOPHAGOGASTRODUODENOSCOPY (EGD) WITH PROPOFOL;  Surgeon: Dolores Frame, MD;  Location: [facility];  Service: Gastroenterology;  Laterality: N/A;    Social History   Socioeconomic History   Marital status: Married    Spouse name: Prbhjot   Number of children: 3   Years of education: Not on file   Highest education level: Not on file  Occupational History   Not on file  Tobacco Use   Smoking status: Never   Smokeless tobacco: Never  Vaping Use   Vaping Use: Never used  Substance and Sexual Activity   Alcohol use: Yes    Alcohol/week: 2.0 standard drinks of alcohol    Types: 2 Standard drinks or equivalent per week    Comment: occasionally   Drug use: No   Sexual activity: Not on file  Other Topics Concern   Not on file  Social History Narrative   Not on file   Social Determinants of Health   Financial Resource Strain: Not on file  Food Insecurity: Not on file  Transportation Needs: Not on file  Physical Activity: Not on file  Stress: Not on file  Social Connections: Not on file  Intimate Partner Violence: Not on file    Outpatient Encounter Medications as of 05/08/2022  Medication Sig   atorvastatin (LIPITOR) 20 MG tablet TAKE 1 TABLET BY MOUTH EVERY DAY   Blood Glucose Monitoring Suppl (ACCU-CHEK GUIDE) w/Device KIT Test BS BID Dx E11.9  Dulaglutide (TRULICITY) 3 MG/0.5ML SOPN Inject 3 mg as directed once a week.   ferrous sulfate ([facility] IRON) 325 (65 FE) MG tablet TAKE 1 TABLET BY MOUTH 2 TIMES DAILY.   fluticasone (FLONASE) 50 MCG/ACT nasal spray Place 2 sprays into both nostrils daily.   glipiZIDE (GLUCOTROL) 10 MG tablet TAKE 1 TABLET BY MOUTH DAILY BEFORE BREAKFAST.   glucose blood (ACCU-CHEK GUIDE) test strip Test BS BID Dx E11.9   guaiFENesin (MUCINEX) 600 MG 12 hr tablet Take 1 tablet (600 mg total) by mouth 2 (two) times daily.    JARDIANCE 25 MG TABS tablet TAKE 1 TABLET (25 MG TOTAL) BY MOUTH DAILY.   levocetirizine (XYZAL) 5 MG tablet TAKE 1 TABLET BY MOUTH EVERY DAY IN THE EVENING   magnesium citrate ([facility]S MAGNESIUM CITRATE) SOLN TAKE 296 MLS (1 BOTTLE TOTAL) BY MOUTH ONCE FOR 1 DOSE. DAILY AS NEEDED FOR CONSTIPATION   pantoprazole (PROTONIX) 40 MG tablet Take 1 tablet (40 mg total) by mouth daily.   ramipril (ALTACE) 10 MG capsule Take 1 capsule (10 mg total) by mouth daily.   zinc gluconate 50 MG tablet Take 1 tablet (50 mg total) by mouth daily.   zolpidem (AMBIEN) 5 MG tablet Take 1 tablet (5 mg total) by mouth at bedtime as needed for sleep.   celecoxib (CELEBREX) 200 MG capsule Take 1 capsule (200 mg total) by mouth daily. With food, for arthritis   [DISCONTINUED] celecoxib (CELEBREX) 200 MG capsule Take 1 capsule (200 mg total) by mouth daily. With food, for arthritis (Patient not taking: Reported on 05/08/2022)   No facility-administered encounter medications on file as of 05/08/2022.    No Known Allergies  Review of Systems  Constitutional:  Negative for activity change, appetite change, chills, diaphoresis, fatigue, fever and unexpected weight change.  HENT:  Positive for congestion, postnasal drip and rhinorrhea.   Eyes: Negative.  Negative for photophobia and visual disturbance.  Respiratory:  Negative for cough, chest tightness and shortness of breath.   Cardiovascular:  Negative for chest pain, palpitations and leg swelling.  Gastrointestinal:  Negative for abdominal pain, blood in stool, constipation, diarrhea, nausea and vomiting.  Endocrine: Negative.  Negative for polydipsia, polyphagia and polyuria.  Genitourinary:  Negative for decreased urine volume, difficulty urinating, dysuria, frequency and urgency.  Musculoskeletal:  Positive for arthralgias, neck pain and neck stiffness. Negative for back pain, gait problem, joint swelling and myalgias.  Skin: Negative.   Allergic/Immunologic: Negative.    Neurological:  Negative for dizziness, tremors, seizures, syncope, facial asymmetry, speech difficulty, weakness, light-headedness, numbness and headaches.  Hematological: Negative.   Psychiatric/Behavioral:  Negative for confusion, hallucinations, sleep disturbance and suicidal ideas.   All other systems reviewed and are negative.       Objective:  BP 117/69   Pulse 81   Temp (!) 96.3 F (35.7 C) (Temporal)   Ht 6\' 1"  (1.854 m)   Wt 218 lb 9.6 oz (99.2 kg)   SpO2 99%   BMI 28.84 kg/m    Wt Readings from Last 3 Encounters:  05/08/22 218 lb 9.6 oz (99.2 kg)  04/21/22 218 lb 6.4 oz (99.1 kg)  04/02/22 217 lb 12.8 oz (98.8 kg)    Physical Exam Vitals and nursing note reviewed.  Constitutional:      General: He is not in acute distress.    Appearance: Normal appearance. He is not ill-appearing, toxic-appearing or diaphoretic.  HENT:     Head: Normocephalic and atraumatic.     Right Ear:  A middle ear effusion is present. Tympanic membrane is not erythematous.     Left Ear: A middle ear effusion is present. Tympanic membrane is not erythematous.     Nose: Rhinorrhea present. No congestion. Rhinorrhea is clear.     Right Sinus: No maxillary sinus tenderness or frontal sinus tenderness.     Left Sinus: No maxillary sinus tenderness or frontal sinus tenderness.     Mouth/Throat:     Mouth: Mucous membranes are moist.     Pharynx: Oropharynx is clear. No posterior oropharyngeal erythema.     Comments: Cobblestoning to posterior oropharynx Eyes:     Conjunctiva/sclera: Conjunctivae normal.     Pupils: Pupils are equal, round, and reactive to light.  Cardiovascular:     Rate and Rhythm: Normal rate and regular rhythm.     Heart sounds: Normal heart sounds.  Pulmonary:     Effort: Pulmonary effort is normal.     Breath sounds: Normal breath sounds.  Musculoskeletal:     Cervical back: Tenderness present. No swelling, edema, deformity, erythema, signs of trauma, lacerations,  rigidity, spasms, torticollis, bony tenderness or crepitus. Muscular tenderness present. No pain with movement or spinous process tenderness. Decreased range of motion.     Thoracic back: Normal.  Skin:    General: Skin is warm and dry.     Capillary Refill: Capillary refill takes less than 2 seconds.  Neurological:     General: No focal deficit present.     Mental Status: He is alert and oriented to person, place, and time.  Psychiatric:        Mood and Affect: Mood normal.        Behavior: Behavior normal.        Thought Content: Thought content normal.        Judgment: Judgment normal.     Results for orders placed or performed in visit on 04/21/22  Bayer DCA Hb A1c Waived  Result Value Ref Range   HB A1C (BAYER DCA - WAIVED) 7.1 (H) 4.8 - 5.6 %  CBC with Differential/Platelet  Result Value Ref Range   WBC 8.1 3.4 - 10.8 x10E3/uL   RBC 5.49 4.14 - 5.80 x10E6/uL   Hemoglobin 14.7 13.0 - 17.7 g/dL   Hematocrit 44.9 20.1 - 51.0 %   MCV 80 79 - 97 fL   MCH 26.8 26.6 - 33.0 pg   MCHC 33.6 31.5 - 35.7 g/dL   RDW 00.7 12.1 - 97.5 %   Platelets 253 150 - 450 x10E3/uL   Neutrophils 44 Not Estab. %   Lymphs 39 Not Estab. %   Monocytes 10 Not Estab. %   Eos 6 Not Estab. %   Basos 1 Not Estab. %   Neutrophils Absolute 3.6 1.4 - 7.0 x10E3/uL   Lymphocytes Absolute 3.1 0.7 - 3.1 x10E3/uL   Monocytes Absolute 0.8 0.1 - 0.9 x10E3/uL   EOS (ABSOLUTE) 0.5 (H) 0.0 - 0.4 x10E3/uL   Basophils Absolute 0.1 0.0 - 0.2 x10E3/uL   Immature Granulocytes 0 Not Estab. %   Immature Grans (Abs) 0.0 0.0 - 0.1 x10E3/uL  CMP14+EGFR  Result Value Ref Range   Glucose 103 (H) 70 - 99 mg/dL   BUN 13 6 - 24 mg/dL   Creatinine, Ser 8.83 (L) 0.76 - 1.27 mg/dL   eGFR 254 >98 YM/EBR/8.30   BUN/Creatinine Ratio 18 9 - 20   Sodium 138 134 - 144 mmol/L   Potassium 4.4 3.5 - 5.2 mmol/L   Chloride 100  96 - 106 mmol/L   CO2 23 20 - 29 mmol/L   Calcium 9.8 8.7 - 10.2 mg/dL   Total Protein 8.3 6.0 - 8.5 g/dL    Albumin 4.6 3.8 - 4.9 g/dL   Globulin, Total 3.7 1.5 - 4.5 g/dL   Albumin/Globulin Ratio 1.2 1.2 - 2.2   Bilirubin Total 0.4 0.0 - 1.2 mg/dL   Alkaline Phosphatase 51 44 - 121 IU/L   AST 32 0 - 40 IU/L   ALT 29 0 - 44 IU/L  Lipid panel  Result Value Ref Range   Cholesterol, Total 152 100 - 199 mg/dL   Triglycerides 65 0 - 149 mg/dL   HDL 46 >31>39 mg/dL   VLDL Cholesterol Cal 13 5 - 40 mg/dL   LDL Chol Calc (NIH) 93 0 - 99 mg/dL   Chol/HDL Ratio 3.3 0.0 - 5.0 ratio       Pertinent labs & imaging results that were available during my care of the patient were reviewed by me and considered in my medical decision making.  Assessment & Plan:  Kenneth Stone was seen today for neck pain.  Diagnoses and all orders for this visit:  Spondylosis, cervical, with myelopathy Will provide short refill of Celebrex as pt has run out. Will burst with steroids today to help with inflammation and pain. Pt aware to report new, worsening, or persistent symptoms. Renal function and blood sugar reviewed. Follow up with PCP for reevaluation and continued refills on medications.  -     celecoxib (CELEBREX) 200 MG capsule; Take 1 capsule (200 mg total) by mouth daily. With food, for arthritis  Seasonal allergic rhinitis due to pollen Will add below to regimen to see if beneficial. Aware to report continued symptoms.  -     fluticasone (FLONASE) 50 MCG/ACT nasal spray; Place 2 sprays into both nostrils daily.     Continue all other maintenance medications.  Follow up plan: Return in about 4 weeks (around 06/05/2022) for PCP for reevaluation .   Continue healthy lifestyle choices, including diet (rich in fruits, vegetables, and lean proteins, and low in salt and simple carbohydrates) and exercise (at least 30 minutes of moderate physical activity daily).  Educational handout given for cervical radiculopathy   The above assessment and management plan was discussed with the patient. The patient verbalized  understanding of and has agreed to the management plan. Patient is aware to call the clinic if they develop any new symptoms or if symptoms persist or worsen. Patient is aware when to return to the clinic for a follow-up visit. Patient educated on when it is appropriate to go to the emergency department.   Kari BaarsMichelle Miyako Oelke, FNP-C [facility] [phone]

## 2022-05-08 NOTE — Telephone Encounter (Signed)
Pt says he can't pick up his Jardiance Rx at the pharmacy because it is requiring PA.

## 2022-05-20 ENCOUNTER — Other Ambulatory Visit: Payer: Self-pay | Admitting: Family Medicine

## 2022-05-20 DIAGNOSIS — M4712 Other spondylosis with myelopathy, cervical region: Secondary | ICD-10-CM

## 2022-05-29 ENCOUNTER — Other Ambulatory Visit (HOSPITAL_COMMUNITY): Payer: Self-pay

## 2022-05-29 ENCOUNTER — Telehealth: Payer: Self-pay | Admitting: Family Medicine

## 2022-05-29 NOTE — Telephone Encounter (Signed)
Pt says that he has not had Dulaglutide (TRULICITY) 3 MG/0.5ML SOPN because pharmacy out of stock. Pt has not take rx for a month. Pt saying that jardiance was going to changed to farxiga because pt insurance will not cover jardiance. Pt says that still no farxiga rx has been sent in. [facility]

## 2022-05-30 ENCOUNTER — Telehealth: Payer: Self-pay

## 2022-05-30 NOTE — Telephone Encounter (Signed)
I don't see where anything was mentioned about changing him to [place] in the last OV? Please advise.

## 2022-05-30 NOTE — Telephone Encounter (Signed)
PA has been submitted and is pending. Documented in separate encounter

## 2022-05-30 NOTE — Telephone Encounter (Signed)
Patient Advocate Encounter   Received notification from [employer] that prior authorization for [place] is required.   PA submitted on 05/30/2022 Key [id number] Status is pending

## 2022-06-02 ENCOUNTER — Other Ambulatory Visit: Payer: Self-pay | Admitting: Family Medicine

## 2022-06-02 MED ORDER — DAPAGLIFLOZIN PROPANEDIOL 10 MG PO TABS: 10.0000 mg | ORAL_TABLET | Freq: Every day | ORAL | 3 refills | Status: DC

## 2022-06-02 NOTE — Telephone Encounter (Signed)
Just have to wait for the trulicity. All of this class of meds is having significant shortages.  I sent in the [place]

## 2022-06-03 NOTE — Telephone Encounter (Signed)
Per another phone message, Kenneth Stone has been approved. Left detailed voice message for patient.

## 2022-06-03 NOTE — Telephone Encounter (Signed)
Addressed in another phone message. Kenneth Stone has been approved. Left detailed voice message for patient,.

## 2022-06-03 NOTE — Telephone Encounter (Signed)
Pharmacy Patient Advocate Encounter  Prior Authorization for [place] Pepper has been approved by [facility] [employer] (ins).    PA # [id number] Effective dates: 05/30/2022 through 05/30/2023

## 2022-06-04 NOTE — Telephone Encounter (Signed)
See additional encounter created for updates  

## 2022-06-05 NOTE — Telephone Encounter (Signed)
See previous note, do not route back to PA team please

## 2022-06-11 ENCOUNTER — Telehealth: Payer: Self-pay | Admitting: Family Medicine

## 2022-06-11 NOTE — Telephone Encounter (Signed)
  Prescription Request  06/11/2022  Is this a "Controlled Substance" medicine? no  Have you seen your PCP in the last 2 weeks? No pt has appt on 07/23/22  If YES, route message to pool  -  If NO, patient needs to be scheduled for appointment.  What is the name of the medication or equipment? Does not remember name of medication thinks that it may be lexapro? Pt says it is for anxiety and it helped him sleep as well he stopped taking it about 3-4 months ago or maybe more than that  Have you contacted your pharmacy to request a refill? yes   Which pharmacy would you like this sent to? [facility]    Patient notified that their request is being sent to the clinical staff for review and that they should receive a response within 2 business days.

## 2022-06-11 NOTE — Telephone Encounter (Signed)
Patient never on Lexapro. Will need appointment to discuss a med restart. First available is fine.

## 2022-06-11 NOTE — Telephone Encounter (Signed)
LMTCB to schedule appt

## 2022-06-12 NOTE — Telephone Encounter (Signed)
See additional note.. This does not need to be routed back to PA team

## 2022-06-22 ENCOUNTER — Other Ambulatory Visit: Payer: Self-pay | Admitting: Family Medicine

## 2022-06-25 NOTE — Telephone Encounter (Signed)
Please sign off on encounter as PA team is unable to resolve Rx Requests. Thanks  

## 2022-07-07 ENCOUNTER — Other Ambulatory Visit: Payer: Self-pay | Admitting: Family Medicine

## 2022-07-23 ENCOUNTER — Telehealth: Payer: Self-pay

## 2022-07-23 ENCOUNTER — Ambulatory Visit (INDEPENDENT_AMBULATORY_CARE_PROVIDER_SITE_OTHER): Payer: 59 | Admitting: Family Medicine

## 2022-07-23 ENCOUNTER — Encounter: Payer: Self-pay | Admitting: Family Medicine

## 2022-07-23 VITALS — BP 105/74 | HR 64 | Temp 97.3°F | Ht 73.0 in | Wt 213.4 lb

## 2022-07-23 DIAGNOSIS — Z7984 Long term (current) use of oral hypoglycemic drugs: Secondary | ICD-10-CM

## 2022-07-23 DIAGNOSIS — E119 Type 2 diabetes mellitus without complications: Secondary | ICD-10-CM

## 2022-07-23 DIAGNOSIS — R051 Acute cough: Secondary | ICD-10-CM

## 2022-07-23 DIAGNOSIS — I10 Essential (primary) hypertension: Secondary | ICD-10-CM

## 2022-07-23 DIAGNOSIS — E785 Hyperlipidemia, unspecified: Secondary | ICD-10-CM

## 2022-07-23 DIAGNOSIS — E559 Vitamin D deficiency, unspecified: Secondary | ICD-10-CM | POA: Diagnosis not present

## 2022-07-23 DIAGNOSIS — Z125 Encounter for screening for malignant neoplasm of prostate: Secondary | ICD-10-CM | POA: Diagnosis not present

## 2022-07-23 DIAGNOSIS — D509 Iron deficiency anemia, unspecified: Secondary | ICD-10-CM

## 2022-07-23 LAB — IRON,TIBC AND FERRITIN PANEL

## 2022-07-23 LAB — BAYER DCA HB A1C WAIVED: HB A1C (BAYER DCA - WAIVED): 6.8 % — ABNORMAL HIGH (ref 4.8–5.6)

## 2022-07-23 LAB — CMP14+EGFR

## 2022-07-23 LAB — VITAMIN D 25 HYDROXY (VIT D DEFICIENCY, FRACTURES)

## 2022-07-23 LAB — PSA, TOTAL AND FREE

## 2022-07-23 LAB — LIPID PANEL

## 2022-07-23 MED ORDER — ESCITALOPRAM OXALATE 10 MG PO TABS: 10.0000 mg | ORAL_TABLET | Freq: Every day | ORAL | 1 refills | Status: DC

## 2022-07-23 MED ORDER — JANUMET 50-1000 MG PO TABS: 1.0000 | ORAL_TABLET | Freq: Two times a day (BID) | ORAL | 3 refills | Status: DC

## 2022-07-23 MED ORDER — ATORVASTATIN CALCIUM 20 MG PO TABS: 20.0000 mg | ORAL_TABLET | Freq: Every day | ORAL | 3 refills | Status: DC

## 2022-07-23 MED ORDER — BENZONATATE 200 MG PO CAPS
200.0000 mg | ORAL_CAPSULE | Freq: Three times a day (TID) | ORAL | 2 refills | Status: DC | PRN
Start: 2022-07-23 — End: 2023-02-09

## 2022-07-23 MED ORDER — EMPAGLIFLOZIN 25 MG PO TABS: 25.0000 mg | ORAL_TABLET | Freq: Every day | ORAL | 11 refills | Status: DC

## 2022-07-23 NOTE — Progress Notes (Signed)
Subjective:  Patient ID: Kenneth Stone, male    DOB: [birth year]  Age: 58 y.o. MRN: [id number]  CC: Medical Management of Chronic Issues   HPI Kenneth Stone presents forFollow-up of diabetes. Patient checks blood sugar at home.   100 -150 mostly Patient denies symptoms such as polyuria, polydipsia, excessive hunger, nausea No significant hypoglycemic spells noted. Medications reviewed. Pt reports taking them regularly without complication/adverse reaction being reported today.    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   He has a past surgical history that includes Colonoscopy (N/A, 01/23/2017); Colonoscopy with propofol (N/A, 04/17/2021); Esophagogastroduodenoscopy (egd) with propofol (N/A, 04/17/2021); and biopsy (04/17/2021).   His family history includes Cancer in his mother; Heart disease in his father.He reports that he has never smoked. He has never used smokeless tobacco. He reports current alcohol use of about 2.0 standard drinks of alcohol per week. He reports that he does not use drugs.  Current Outpatient Medications on File Prior to Visit  Medication Sig Dispense Refill   Blood Glucose Monitoring Suppl (ACCU-CHEK GUIDE) w/Device KIT Test BS BID Dx E11.9 1 kit 0   celecoxib (CELEBREX) 200 MG capsule TAKE 1 CAPSULE (200 MG TOTAL) BY MOUTH DAILY. WITH FOOD, FOR ARTHRITIS 30 capsule 1   Dulaglutide (TRULICITY) 3 MG/0.5ML SOPN Inject 3 mg as directed once a week. 6 mL 1   ferrous sulfate 325 (65 FE) MG tablet TAKE 1 TABLET BY MOUTH TWICE A DAY 180 tablet 1   fluticasone (FLONASE) 50 MCG/ACT nasal spray Place 2 sprays into both nostrils daily. 16 g 6   glipiZIDE (GLUCOTROL) 10 MG tablet TAKE 1 TABLET BY MOUTH DAILY BEFORE BREAKFAST. 30 tablet 11   glucose blood (ACCU-CHEK GUIDE)  test strip Test BS BID Dx E11.9 200 each 3   guaiFENesin (MUCINEX) 600 MG 12 hr tablet Take 1 tablet (600 mg total) by mouth 2 (two) times daily. 30 tablet 0   levocetirizine (XYZAL) 5 MG tablet TAKE 1 TABLET BY MOUTH EVERY DAY IN THE EVENING 90 tablet 1   magnesium citrate ([facility] MAGNESIUM CITRATE) SOLN TAKE 296 MLS (1 BOTTLE TOTAL) BY MOUTH ONCE FOR 1 DOSE. DAILY AS NEEDED FOR CONSTIPATION 592 mL 1   pantoprazole (PROTONIX) 40 MG tablet Take 1 tablet (40 mg total) by mouth daily. 90 tablet 3   ramipril (ALTACE) 10 MG capsule Take 1 capsule (10 mg total) by mouth daily. 90 capsule 3   zinc gluconate 50 MG tablet Take 1 tablet (50 mg total) by mouth daily. 90 tablet 1   zolpidem (AMBIEN) 5 MG tablet Take 1 tablet (5 mg total) by mouth at bedtime as needed for sleep. 7 tablet 0   No current facility-administered medications on file prior to visit.    ROS Review of Systems  Constitutional:  Negative for fever.  Respiratory:  Negative for shortness of breath.   Cardiovascular:  Negative for chest pain.  Musculoskeletal:  Negative for arthralgias.  Skin:  Negative for rash.    Objective:  BP 105/74   Pulse 64   Temp (!) 97.3 F (36.3 C)   Ht 6\' 1"  (1.854 m)   Wt 213 lb 6.4 oz (96.8 kg)   SpO2 99%   BMI 28.15 kg/m   BP Readings from Last 3 Encounters:  07/23/22 105/74  05/08/22 117/69  04/21/22 112/71    Wt Readings from Last  3 Encounters:  07/23/22 213 lb 6.4 oz (96.8 kg)  05/08/22 218 lb 9.6 oz (99.2 kg)  04/21/22 218 lb 6.4 oz (99.1 kg)     Physical Exam Vitals reviewed.  Constitutional:      Appearance: He is well-developed.  HENT:     Head: Normocephalic and atraumatic.     Right Ear: External ear normal.     Left Ear: External ear normal.     Mouth/Throat:     Pharynx: No oropharyngeal exudate or posterior oropharyngeal erythema.  Eyes:     Pupils: Pupils are equal, round, and reactive to light.  Cardiovascular:     Rate and Rhythm: Normal rate and regular  rhythm.     Heart sounds: No murmur heard. Pulmonary:     Effort: No respiratory distress.     Breath sounds: Normal breath sounds.  Musculoskeletal:     Cervical back: Normal range of motion and neck supple.  Neurological:     Mental Status: He is alert and oriented to person, place, and time.       Assessment & Plan:   Kenneth Stone was seen today for medical management of chronic issues.  Diagnoses and all orders for this visit:  Essential hypertension -     CBC with Differential/Platelet -     CMP14+EGFR  Hyperlipidemia, unspecified hyperlipidemia type -     Lipid panel  Type 2 diabetes mellitus without complication, without long-term current use of insulin (HCC) -     Bayer DCA Hb A1c Waived  Iron deficiency anemia, unspecified iron deficiency anemia type -     Iron, TIBC and Ferritin Panel  Prostate cancer screening -     PSA, total and free  Vitamin D deficiency -     VITAMIN D 25 Hydroxy (Vit-D Deficiency, Fractures)  Acute cough -     benzonatate (TESSALON) 200 MG capsule; Take 1 capsule (200 mg total) by mouth 3 (three) times daily as needed for cough.  Other orders -     atorvastatin (LIPITOR) 20 MG tablet; Take 1 tablet (20 mg total) by mouth daily. -     sitaGLIPtin-metformin (JANUMET) 50-1000 MG tablet; Take 1 tablet by mouth 2 (two) times daily with a meal. -     empagliflozin (JARDIANCE) 25 MG TABS tablet; Take 1 tablet (25 mg total) by mouth daily. -     escitalopram (LEXAPRO) 10 MG tablet; Take 1 tablet (10 mg total) by mouth daily.      I have discontinued Kenneth Stone's dapagliflozin propanediol. I have changed his Jardiance to empagliflozin. I have also changed his atorvastatin and benzonatate. Additionally, I am having him start on escitalopram. Lastly, I am having him maintain his Accu-Chek Guide, Accu-Chek Guide, ramipril, guaiFENesin, Trulicity, zinc gluconate, glipiZIDE, levocetirizine, pantoprazole, zolpidem, [facility] Magnesium Citrate, fluticasone,  celecoxib, ferrous sulfate, and Janumet.  Meds ordered this encounter  Medications   atorvastatin (LIPITOR) 20 MG tablet    Sig: Take 1 tablet (20 mg total) by mouth daily.    Dispense:  90 tablet    Refill:  3   sitaGLIPtin-metformin (JANUMET) 50-1000 MG tablet    Sig: Take 1 tablet by mouth 2 (two) times daily with a meal.    Dispense:  180 tablet    Refill:  3   empagliflozin (JARDIANCE) 25 MG TABS tablet    Sig: Take 1 tablet (25 mg total) by mouth daily.    Dispense:  90 tablet    Refill:  11   escitalopram (  LEXAPRO) 10 MG tablet    Sig: Take 1 tablet (10 mg total) by mouth daily.    Dispense:  90 tablet    Refill:  1   benzonatate (TESSALON) 200 MG capsule    Sig: Take 1 capsule (200 mg total) by mouth 3 (three) times daily as needed for cough.    Dispense:  50 capsule    Refill:  2     Follow-up: Return in about 3 months (around 10/23/2022).  Mechele Claude, M.D.

## 2022-07-23 NOTE — Telephone Encounter (Signed)
If we can get him enough of the 1.5 to take 2 shots once a week, trulicity will be best. See if the pharmacy can run that. Otherwise send the Jardiance for prior auth.    Meanwhile, he will be okay for a while with just the Janumet.

## 2022-07-23 NOTE — Telephone Encounter (Signed)
Pharmacy called states that Kenneth Stone will require a PA and patient states that he is completely out of medication. They are still out of stock on the Trulicity 3mg  but they do have 1.5mg  in stock. Please advise.

## 2022-07-24 ENCOUNTER — Other Ambulatory Visit: Payer: Self-pay | Admitting: Family Medicine

## 2022-07-24 LAB — CBC WITH DIFFERENTIAL/PLATELET
Basophils Absolute: 0.1 10*3/uL (ref 0.0–0.2)
Basos: 1 %
EOS (ABSOLUTE): 0.2 10*3/uL (ref 0.0–0.4)
Eos: 3 %
Hematocrit: 42.7 % (ref 37.5–51.0)
Hemoglobin: 13.8 g/dL (ref 13.0–17.7)
Immature Grans (Abs): 0 10*3/uL (ref 0.0–0.1)
Immature Granulocytes: 0 %
Lymphocytes Absolute: 2.5 10*3/uL (ref 0.7–3.1)
Lymphs: 31 %
MCH: 26.4 pg — ABNORMAL LOW (ref 26.6–33.0)
MCHC: 32.3 g/dL (ref 31.5–35.7)
MCV: 82 fL (ref 79–97)
Monocytes Absolute: 0.7 10*3/uL (ref 0.1–0.9)
Monocytes: 9 %
Neutrophils Absolute: 4.5 10*3/uL (ref 1.4–7.0)
Neutrophils: 56 %
Platelets: 235 10*3/uL (ref 150–450)
RBC: 5.22 x10E6/uL (ref 4.14–5.80)
RDW: 14.4 % (ref 11.6–15.4)
WBC: 8.1 10*3/uL (ref 3.4–10.8)

## 2022-07-24 LAB — PSA, TOTAL AND FREE
PSA, Free Pct: 17.1 %
PSA, Free: 0.29 ng/mL

## 2022-07-24 LAB — CMP14+EGFR
ALT: 33 IU/L (ref 0–44)
Albumin: 4.4 g/dL (ref 3.8–4.9)
Alkaline Phosphatase: 52 IU/L (ref 44–121)
BUN/Creatinine Ratio: 19 (ref 9–20)
Bilirubin Total: 0.6 mg/dL (ref 0.0–1.2)
CO2: 20 mmol/L (ref 20–29)
Calcium: 9.7 mg/dL (ref 8.7–10.2)
Chloride: 103 mmol/L (ref 96–106)
Creatinine, Ser: 0.78 mg/dL (ref 0.76–1.27)
Globulin, Total: 3.2 g/dL (ref 1.5–4.5)
Glucose: 137 mg/dL — ABNORMAL HIGH (ref 70–99)
Potassium: 4.4 mmol/L (ref 3.5–5.2)
Sodium: 137 mmol/L (ref 134–144)
Total Protein: 7.6 g/dL (ref 6.0–8.5)
eGFR: 103 mL/min/{1.73_m2} (ref >59)

## 2022-07-24 LAB — LIPID PANEL
Chol/HDL Ratio: 2.8 ratio (ref 0.0–5.0)
Cholesterol, Total: 162 mg/dL (ref 100–199)
HDL: 58 mg/dL (ref >39)
LDL Chol Calc (NIH): 91 mg/dL (ref 0–99)
Triglycerides: 64 mg/dL (ref 0–149)

## 2022-07-24 LAB — IRON,TIBC AND FERRITIN PANEL
Iron Saturation: 21 % (ref 15–55)
Iron: 75 ug/dL (ref 38–169)
Total Iron Binding Capacity: 362 ug/dL (ref 250–450)
UIBC: 287 ug/dL (ref 111–343)

## 2022-07-24 NOTE — Telephone Encounter (Signed)
[name] called about this. Reviewed Dr Darlyn Read note with her. She says she knows pts insurance wont cover 2 shots so they will wait on PA to be completed for the Jardiance.

## 2022-07-24 NOTE — Progress Notes (Signed)
Dear Kenneth Stone, Your Vitamin D is  low. You need a prescription strength supplement I will send that in for you. Everything else looks normal. Nurse, if at all possible, could you send in a prescription for the patient for vitamin D 50,000 units, 1 p.o. weekly #13 with 3 refills? Many thanks, WS

## 2022-07-25 ENCOUNTER — Other Ambulatory Visit: Payer: Self-pay | Admitting: *Deleted

## 2022-07-25 MED ORDER — VITAMIN D (ERGOCALCIFEROL) 1.25 MG (50000 UNIT) PO CAPS: 50000.0000 [IU] | ORAL_CAPSULE | ORAL | 3 refills | Status: DC

## 2022-07-25 NOTE — Telephone Encounter (Signed)
Spoke with patient and pharmacy, PA on Janumet started, already has PA approved on Jardiance.

## 2022-07-27 ENCOUNTER — Other Ambulatory Visit: Payer: Self-pay | Admitting: Family Medicine

## 2022-07-30 ENCOUNTER — Telehealth: Payer: Self-pay

## 2022-08-12 ENCOUNTER — Other Ambulatory Visit: Payer: Self-pay | Admitting: Family Medicine

## 2022-08-22 ENCOUNTER — Telehealth: Payer: Self-pay | Admitting: Family Medicine

## 2022-08-22 NOTE — Telephone Encounter (Signed)
Pt called requesting to make a new patient appt for his new daughter in law with Dr Darlyn Read. Tried explaining to pt that Dr Darlyn Read is not taking anymore new patients right now and that he is booked out anyway and that it would be best to schedule with one of the providers that is taking new patients that could see her sooner. Pt got very rude and upset with me because I was not giving him what he wanted. I ended up transferring his call to office manager.

## 2022-08-24 NOTE — Telephone Encounter (Signed)
I will see her at routine 30 min physical slot as available since I have a close relationship with the family.

## 2022-08-29 ENCOUNTER — Telehealth (INDEPENDENT_AMBULATORY_CARE_PROVIDER_SITE_OTHER): Payer: 59 | Admitting: Family Medicine

## 2022-08-29 ENCOUNTER — Encounter: Payer: Self-pay | Admitting: Family Medicine

## 2022-08-29 DIAGNOSIS — L308 Other specified dermatitis: Secondary | ICD-10-CM | POA: Diagnosis not present

## 2022-08-29 MED ORDER — PREDNISONE 10 MG (21) PO TBPK
ORAL_TABLET | ORAL | 0 refills | Status: AC
Start: 2022-08-29 — End: ?

## 2022-08-29 NOTE — Progress Notes (Signed)
Virtual Visit via Video   I connected with patient on 08/29/22 at 1345 by a video enabled telemedicine application and verified that I am speaking with the correct person using two identifiers.  Location patient: Home Location provider: [facility] Persons participating in the virtual visit: Patient and Provider  I discussed the limitations of evaluation and management by telemedicine and the availability of in person appointments. The patient expressed understanding and agreed to proceed.  Subjective:   HPI:  Pt presents today for  Chief Complaint  Patient presents with   Rash   Rash This is a new problem. The current episode started in the past 7 days. The problem has been gradually worsening since onset. The affected locations include the right lower leg, right arm, left lower leg and left arm. The rash is characterized by redness, itchiness and blistering. He was exposed to plant contact. Pertinent negatives include no anorexia, congestion, cough, diarrhea, eye pain, facial edema, fatigue, fever, joint pain, nail changes, rhinorrhea, shortness of breath, sore throat or vomiting. Past treatments include anti-itch cream. The treatment provided no relief.     Review of Systems  Constitutional:  Negative for chills, diaphoresis, fatigue, fever, malaise/fatigue and weight loss.  HENT:  Negative for congestion, rhinorrhea and sore throat.   Eyes:  Negative for pain and redness.  Respiratory:  Negative for cough and shortness of breath.   Cardiovascular:  Negative for chest pain and palpitations.  Gastrointestinal:  Negative for anorexia, diarrhea, nausea and vomiting.  Musculoskeletal:  Negative for joint pain and myalgias.  Skin:  Positive for itching and rash. Negative for nail changes.  Neurological:  Negative for dizziness, loss of consciousness and headaches.  All other systems reviewed and are negative.    Patient Active Problem List    Diagnosis Date Noted   Acute recurrent frontal sinusitis 01/08/2022   Iron deficiency anemia 03/25/2021   Abdominal pain 03/25/2021   Radicular pain 03/04/2016   Spondylosis, cervical, with myelopathy 02/06/2016   Varicose veins of bilateral lower extremities with other complications 02/27/2015   Hyperlipidemia 12/31/2014   Hematuria 12/31/2014   Vitamin D deficiency 12/28/2014   Varicose vein of leg 09/05/2014   Diabetes mellitus type 2, controlled, without complications (HCC) 09/05/2014   Essential hypertension 09/05/2014   Gastroesophageal reflux disease without esophagitis 09/05/2014    Social History   Tobacco Use   Smoking status: Never   Smokeless tobacco: Never  Substance Use Topics   Alcohol use: Yes    Alcohol/week: 2.0 standard drinks of alcohol    Types: 2 Standard drinks or equivalent per week    Comment: occasionally    Current Outpatient Medications:    predniSONE (STERAPRED UNI-PAK 21 TAB) 10 MG (21) TBPK tablet, Use as directed on back of pill pack, Disp: 21 tablet, Rfl: 0   Vitamin D, Ergocalciferol, (DRISDOL) 1.25 MG (50000 UNIT) CAPS capsule, Take 1 capsule (50,000 Units total) by mouth every 7 (seven) days., Disp: 13 capsule, Rfl: 3   atorvastatin (LIPITOR) 20 MG tablet, Take 1 tablet (20 mg total) by mouth daily., Disp: 90 tablet, Rfl: 3   benzonatate (TESSALON) 200 MG capsule, Take 1 capsule (200 mg total) by mouth 3 (three) times daily as needed for cough., Disp: 50 capsule, Rfl: 2   Blood Glucose Monitoring Suppl (ACCU-CHEK GUIDE) w/Device KIT, Test BS BID Dx E11.9, Disp: 1 kit, Rfl: 0   celecoxib (CELEBREX) 200 MG capsule, TAKE 1 CAPSULE (200 MG TOTAL) BY MOUTH DAILY.  WITH FOOD, FOR ARTHRITIS, Disp: 30 capsule, Rfl: 1   Dulaglutide (TRULICITY) 3 MG/0.5ML SOPN, INJECT 3 MG AS DIRECTED ONCE A WEEK., Disp: 6 mL, Rfl: 0   empagliflozin (JARDIANCE) 25 MG TABS tablet, Take 1 tablet (25 mg total) by mouth daily., Disp: 90 tablet, Rfl: 11   escitalopram (LEXAPRO)  10 MG tablet, Take 1 tablet (10 mg total) by mouth daily., Disp: 90 tablet, Rfl: 1   ferrous sulfate 325 (65 FE) MG tablet, TAKE 1 TABLET BY MOUTH TWICE A DAY, Disp: 180 tablet, Rfl: 1   fluticasone (FLONASE) 50 MCG/ACT nasal spray, Place 2 sprays into both nostrils daily., Disp: 16 g, Rfl: 6   glipiZIDE (GLUCOTROL) 10 MG tablet, TAKE 1 TABLET BY MOUTH DAILY BEFORE BREAKFAST., Disp: 30 tablet, Rfl: 11   glucose blood (ACCU-CHEK GUIDE) test strip, Test BS BID Dx E11.9, Disp: 200 each, Rfl: 3   guaiFENesin (MUCINEX) 600 MG 12 hr tablet, Take 1 tablet (600 mg total) by mouth 2 (two) times daily., Disp: 30 tablet, Rfl: 0   levocetirizine (XYZAL) 5 MG tablet, TAKE 1 TABLET BY MOUTH EVERY DAY IN THE EVENING, Disp: 90 tablet, Rfl: 1   magnesium citrate (CVS MAGNESIUM CITRATE) SOLN, TAKE 296 MLS (1 BOTTLE TOTAL) BY MOUTH ONCE FOR 1 DOSE. DAILY AS NEEDED FOR CONSTIPATION, Disp: 592 mL, Rfl: 1   pantoprazole (PROTONIX) 40 MG tablet, Take 1 tablet (40 mg total) by mouth daily., Disp: 90 tablet, Rfl: 3   ramipril (ALTACE) 10 MG capsule, Take 1 capsule (10 mg total) by mouth daily., Disp: 90 capsule, Rfl: 3   sitaGLIPtin-metformin (JANUMET) 50-1000 MG tablet, Take 1 tablet by mouth 2 (two) times daily with a meal., Disp: 180 tablet, Rfl: 3   Zinc 50 MG TABS, TAKE 1 TABLET BY MOUTH EVERY DAY, Disp: 30 tablet, Rfl: 5   zinc gluconate 50 MG tablet, Take 1 tablet (50 mg total) by mouth daily., Disp: 90 tablet, Rfl: 1   zolpidem (AMBIEN) 5 MG tablet, Take 1 tablet (5 mg total) by mouth at bedtime as needed for sleep., Disp: 7 tablet, Rfl: 0  No Known Allergies  Objective:   There were no vitals taken for this visit.  Patient is well-developed, well-nourished in no acute distress.  Resting comfortably at home.  Head is normocephalic, atraumatic.  No labored breathing.  Speech is clear and coherent with logical content.  Patient is alert and oriented at baseline.  Red, raised, vesicular rash to bilateral  lower legs in linear pattern  Assessment and Plan:   Kenneth Stone was seen today for rash.  Diagnoses and all orders for this visit:  Pruritic dermatitis Consistent with poison ivy contact dermatitis. Will treat with prednisone as prescribed. Pt aware to report new, worsening, or persistent symptoms. Aware of symptomatic care at home.  -     predniSONE (STERAPRED UNI-PAK 21 TAB) 10 MG (21) TBPK tablet; Use as directed on back of pill pack      Return if symptoms worsen or fail to improve.  Kari Baars, FNP-C [facility] [address] [place], [place] [address] [phone]  08/29/2022  Time spent with the patient: 12 minutes, of which >50% was spent in obtaining information about symptoms, reviewing previous labs, evaluations, and treatments, counseling about condition (please see the discussed topics above), and developing a plan to further investigate it; had a number of questions which I addressed.

## 2022-09-10 ENCOUNTER — Other Ambulatory Visit: Payer: Self-pay | Admitting: Family Medicine

## 2022-09-10 DIAGNOSIS — M4712 Other spondylosis with myelopathy, cervical region: Secondary | ICD-10-CM

## 2022-09-13 ENCOUNTER — Other Ambulatory Visit: Payer: Self-pay | Admitting: Family Medicine

## 2022-09-13 DIAGNOSIS — R051 Acute cough: Secondary | ICD-10-CM

## 2022-09-21 ENCOUNTER — Other Ambulatory Visit: Payer: Self-pay | Admitting: Family Medicine

## 2022-09-21 DIAGNOSIS — M4712 Other spondylosis with myelopathy, cervical region: Secondary | ICD-10-CM

## 2022-09-30 ENCOUNTER — Other Ambulatory Visit: Payer: Self-pay | Admitting: Family Medicine

## 2022-09-30 DIAGNOSIS — H6593 Unspecified nonsuppurative otitis media, bilateral: Secondary | ICD-10-CM

## 2022-09-30 DIAGNOSIS — R051 Acute cough: Secondary | ICD-10-CM

## 2022-10-14 ENCOUNTER — Other Ambulatory Visit: Payer: Self-pay | Admitting: Family Medicine

## 2022-10-21 DIAGNOSIS — E119 Type 2 diabetes mellitus without complications: Secondary | ICD-10-CM | POA: Diagnosis not present

## 2022-10-21 DIAGNOSIS — H40023 Open angle with borderline findings, high risk, bilateral: Secondary | ICD-10-CM | POA: Diagnosis not present

## 2022-10-21 DIAGNOSIS — Z961 Presence of intraocular lens: Secondary | ICD-10-CM | POA: Diagnosis not present

## 2022-10-21 LAB — HM DIABETES EYE EXAM

## 2022-10-22 ENCOUNTER — Other Ambulatory Visit: Payer: Self-pay | Admitting: Family Medicine

## 2022-10-22 DIAGNOSIS — R051 Acute cough: Secondary | ICD-10-CM

## 2022-10-27 ENCOUNTER — Ambulatory Visit (INDEPENDENT_AMBULATORY_CARE_PROVIDER_SITE_OTHER): Payer: 59 | Admitting: Family Medicine

## 2022-10-27 ENCOUNTER — Encounter: Payer: Self-pay | Admitting: Family Medicine

## 2022-10-27 VITALS — BP 122/76 | HR 60 | Temp 97.3°F | Ht 73.0 in | Wt 217.2 lb

## 2022-10-27 DIAGNOSIS — E785 Hyperlipidemia, unspecified: Secondary | ICD-10-CM

## 2022-10-27 DIAGNOSIS — E119 Type 2 diabetes mellitus without complications: Secondary | ICD-10-CM

## 2022-10-27 DIAGNOSIS — Z23 Encounter for immunization: Secondary | ICD-10-CM | POA: Diagnosis not present

## 2022-10-27 DIAGNOSIS — Z7984 Long term (current) use of oral hypoglycemic drugs: Secondary | ICD-10-CM | POA: Diagnosis not present

## 2022-10-27 DIAGNOSIS — I10 Essential (primary) hypertension: Secondary | ICD-10-CM

## 2022-10-27 LAB — CMP14+EGFR
ALT: 27 [IU]/L (ref 0–44)
AST: 25 [IU]/L (ref 0–40)
Albumin: 4.2 g/dL (ref 3.8–4.9)
Alkaline Phosphatase: 45 [IU]/L (ref 44–121)
BUN/Creatinine Ratio: 18 (ref 9–20)
BUN: 14 mg/dL (ref 6–24)
Bilirubin Total: 0.4 mg/dL (ref 0.0–1.2)
CO2: 22 mmol/L (ref 20–29)
Calcium: 9.5 mg/dL (ref 8.7–10.2)
Chloride: 102 mmol/L (ref 96–106)
Creatinine, Ser: 0.76 mg/dL (ref 0.76–1.27)
Globulin, Total: 3.2 g/dL (ref 1.5–4.5)
Glucose: 108 mg/dL — ABNORMAL HIGH (ref 70–99)
Potassium: 4.2 mmol/L (ref 3.5–5.2)
Sodium: 139 mmol/L (ref 134–144)
Total Protein: 7.4 g/dL (ref 6.0–8.5)
eGFR: 104 mL/min/{1.73_m2} (ref >59)

## 2022-10-27 LAB — CBC WITH DIFFERENTIAL/PLATELET
Basophils Absolute: 0.1 10*3/uL (ref 0.0–0.2)
Basos: 1 %
EOS (ABSOLUTE): 0.4 10*3/uL (ref 0.0–0.4)
Eos: 4 %
Hematocrit: 43.8 % (ref 37.5–51.0)
Hemoglobin: 13.8 g/dL (ref 13.0–17.7)
Immature Grans (Abs): 0 10*3/uL (ref 0.0–0.1)
Immature Granulocytes: 0 %
Lymphocytes Absolute: 3.2 10*3/uL — ABNORMAL HIGH (ref 0.7–3.1)
Lymphs: 38 %
MCH: 26.1 pg — ABNORMAL LOW (ref 26.6–33.0)
MCHC: 31.5 g/dL (ref 31.5–35.7)
MCV: 83 fL (ref 79–97)
Monocytes Absolute: 0.7 10*3/uL (ref 0.1–0.9)
Monocytes: 9 %
Neutrophils Absolute: 4 10*3/uL (ref 1.4–7.0)
Neutrophils: 48 %
Platelets: 214 10*3/uL (ref 150–450)
RBC: 5.29 x10E6/uL (ref 4.14–5.80)
RDW: 13.1 % (ref 11.6–15.4)
WBC: 8.3 10*3/uL (ref 3.4–10.8)

## 2022-10-27 LAB — LIPID PANEL
Chol/HDL Ratio: 3.1 {ratio} (ref 0.0–5.0)
Cholesterol, Total: 160 mg/dL (ref 100–199)
HDL: 52 mg/dL (ref >39)
LDL Chol Calc (NIH): 96 mg/dL (ref 0–99)
Triglycerides: 60 mg/dL (ref 0–149)
VLDL Cholesterol Cal: 12 mg/dL (ref 5–40)

## 2022-10-27 LAB — BAYER DCA HB A1C WAIVED: HB A1C (BAYER DCA - WAIVED): 7.5 % — ABNORMAL HIGH (ref 4.8–5.6)

## 2022-10-27 MED ORDER — RAMIPRIL 10 MG PO CAPS: 10.0000 mg | ORAL_CAPSULE | Freq: Every day | ORAL | 3 refills | Status: DC

## 2022-10-27 MED ORDER — TRULICITY 4.5 MG/0.5ML ~~LOC~~ SOAJ: 4.5000 mg | SUBCUTANEOUS | 3 refills | Status: DC

## 2022-10-27 MED ORDER — METFORMIN HCL ER 750 MG PO TB24
1500.0000 mg | ORAL_TABLET | Freq: Every day | ORAL | 3 refills | Status: DC
Start: 2022-10-27 — End: 2023-07-28

## 2022-10-27 MED ORDER — EMPAGLIFLOZIN 10 MG PO TABS: 10.0000 mg | ORAL_TABLET | Freq: Every day | ORAL | 1 refills | Status: DC

## 2022-10-27 NOTE — Progress Notes (Signed)
Subjective:  Patient ID: Kenneth Stone,  male    DOB: Nov 21, 1965  Age: 57 y.o.    CC: Medical Management of Chronic Issues   HPI Kenneth Stone presents for  follow-up of hypertension. Patient has no history of headache chest pain or shortness of breath or recent cough. Patient also denies symptoms of TIA such as numbness weakness lateralizing. Patient denies side effects from medication. States taking it regularly.  Patient also  in for follow-up of elevated cholesterol. Doing well without complaints on current medication. Denies side effects  including myalgia and arthralgia and nausea. Also in today for liver function testing. Currently no chest pain, shortness of breath or other cardiovascular related symptoms noted.  Follow-up of diabetes. Patient does check blood sugar at home. Readings run between 180 and 240 Patient denies symptoms such as excessive hunger or urinary frequency, excessive hunger, nausea No significant hypoglycemic spells noted. Medications reviewed. Pt reports taking them regularly. Januvia causing frequncy of urination every one hour. Cannot always find the trulicity. Takes janumet instead when not available. Using glipizide prn for glucose over 180-240.   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   He has a past surgical history that includes Colonoscopy (N/A, 01/23/2017); Colonoscopy with propofol (N/A, 04/17/2021); Esophagogastroduodenoscopy (egd) with propofol (N/A, 04/17/2021); and biopsy (04/17/2021).   His family history includes Cancer in his mother; Heart disease in his father.He reports that he has never smoked. He has never used smokeless tobacco. He reports current alcohol use of about 2.0 standard drinks of alcohol per week. He reports that he does not  use drugs.  Current Outpatient Medications on File Prior to Visit  Medication Sig Dispense Refill   atorvastatin (LIPITOR) 20 MG tablet Take 1 tablet (20 mg total) by mouth daily. 90 tablet 3   benzonatate (TESSALON) 200 MG capsule Take 1 capsule (200 mg total) by mouth 3 (three) times daily as needed for cough. 50 capsule 2   Blood Glucose Monitoring Suppl (ACCU-CHEK GUIDE) w/Device KIT Test BS BID Dx E11.9 1 kit 0   celecoxib (CELEBREX) 200 MG capsule TAKE 1 CAPSULE (200 MG TOTAL) BY MOUTH DAILY. WITH FOOD FOR ARTHRITIS 30 capsule 0   escitalopram (LEXAPRO) 10 MG tablet Take 1 tablet (10 mg total) by mouth daily. 90 tablet 1   ferrous sulfate 325 (65 FE) MG tablet TAKE 1 TABLET BY MOUTH TWICE A DAY 180 tablet 1   fluticasone (FLONASE) 50 MCG/ACT nasal spray Place 2 sprays into both nostrils daily. 16 g 6   glipiZIDE (GLUCOTROL) 10 MG tablet TAKE 1 TABLET BY MOUTH DAILY BEFORE BREAKFAST. 30 tablet 11   glucose blood (ACCU-CHEK GUIDE) test strip Test BS BID Dx E11.9 200 each 3   levocetirizine (XYZAL) 5 MG tablet TAKE 1 TABLET BY MOUTH EVERY DAY IN THE EVENING 90 tablet 1   pantoprazole (PROTONIX) 40 MG tablet Take 1 tablet (40 mg total) by mouth daily. 90 tablet 3   Vitamin D, Ergocalciferol, (DRISDOL) 1.25 MG (50000 UNIT) CAPS capsule Take 1 capsule (50,000 Units total) by mouth every 7 (seven) days. 13 capsule 3   Zinc 50 MG TABS TAKE 1 TABLET BY MOUTH EVERY DAY 30 tablet 5   zolpidem (AMBIEN) 5 MG tablet Take 1 tablet (5 mg total) by mouth at bedtime as needed for sleep. 7 tablet 0   No current facility-administered medications on  file prior to visit.    ROS Review of Systems  Constitutional:  Negative for fever.  HENT:  Positive for mouth sores (angular chelitis at left. Did not respond to zinc).   Respiratory:  Negative for shortness of breath.   Cardiovascular:  Negative for chest pain.  Genitourinary:  Positive for frequency.  Musculoskeletal:  Negative for arthralgias.  Skin:   Negative for rash.    Objective:  BP 122/76   Pulse 60   Temp (!) 97.3 F (36.3 C)   Ht 6\' 1"  (1.854 m)   Wt 217 lb 3.2 oz (98.5 kg)   SpO2 99%   BMI 28.66 kg/m   BP Readings from Last 3 Encounters:  10/27/22 122/76  07/23/22 105/74  05/08/22 117/69    Wt Readings from Last 3 Encounters:  10/27/22 217 lb 3.2 oz (98.5 kg)  07/23/22 213 lb 6.4 oz (96.8 kg)  05/08/22 218 lb 9.6 oz (99.2 kg)     Physical Exam Vitals reviewed.  Constitutional:      Appearance: He is well-developed.  HENT:     Head: Normocephalic and atraumatic.     Right Ear: External ear normal.     Left Ear: External ear normal.     Mouth/Throat:     Pharynx: No oropharyngeal exudate or posterior oropharyngeal erythema.  Eyes:     Pupils: Pupils are equal, round, and reactive to light.  Cardiovascular:     Rate and Rhythm: Normal rate and regular rhythm.     Heart sounds: No murmur heard. Pulmonary:     Effort: No respiratory distress.     Breath sounds: Normal breath sounds.  Musculoskeletal:     Cervical back: Normal range of motion and neck supple.  Neurological:     Mental Status: He is alert and oriented to person, place, and time.     Diabetic Foot Exam - Simple   Simple Foot Form Diabetic Foot exam was performed with the following findings: Yes 10/27/2022  9:41 AM  Visual Inspection No deformities, no ulcerations, no other skin breakdown bilaterally: Yes Sensation Testing Intact to touch and monofilament testing bilaterally: Yes Pulse Check Posterior Tibialis and Dorsalis pulse intact bilaterally: Yes Comments     Lab Results  Component Value Date   HGBA1C 6.8 (H) 07/23/2022   HGBA1C 7.1 (H) 04/21/2022   HGBA1C 8.2 (H) 01/16/2022    Assessment & Plan:   Kenneth Stone was seen today for medical management of chronic issues.  Diagnoses and all orders for this visit:  Essential hypertension -     CBC with Differential/Platelet -     CMP14+EGFR  Hyperlipidemia, unspecified  hyperlipidemia type -     Lipid panel  Type 2 diabetes mellitus without complication, without long-term current use of insulin (HCC) -     Bayer DCA Hb A1c Waived  Other orders -     ramipril (ALTACE) 10 MG capsule; Take 1 capsule (10 mg total) by mouth daily. -     empagliflozin (JARDIANCE) 10 MG TABS tablet; Take 1 tablet (10 mg total) by mouth daily. -     Dulaglutide (TRULICITY) 4.5 MG/0.5ML SOPN; Inject 4.5 mg as directed once a week. -     metFORMIN (GLUCOPHAGE-XR) 750 MG 24 hr tablet; Take 2 tablets (1,500 mg total) by mouth daily with breakfast.   I have discontinued Vedder Deblasi's guaiFENesin, zinc gluconate, CVS Magnesium Citrate, Janumet, predniSONE, and Trulicity. I have also changed his empagliflozin. Additionally, I am having him start on Trulicity  and metFORMIN. Lastly, I am having him maintain his Accu-Chek Guide, Accu-Chek Guide, glipiZIDE, pantoprazole, zolpidem, fluticasone, ferrous sulfate, atorvastatin, escitalopram, benzonatate, Vitamin D (Ergocalciferol), Zinc, celecoxib, levocetirizine, and ramipril.  Meds ordered this encounter  Medications   ramipril (ALTACE) 10 MG capsule    Sig: Take 1 capsule (10 mg total) by mouth daily.    Dispense:  90 capsule    Refill:  3   empagliflozin (JARDIANCE) 10 MG TABS tablet    Sig: Take 1 tablet (10 mg total) by mouth daily.    Dispense:  90 tablet    Refill:  1   Dulaglutide (TRULICITY) 4.5 MG/0.5ML SOPN    Sig: Inject 4.5 mg as directed once a week.    Dispense:  6 mL    Refill:  3   metFORMIN (GLUCOPHAGE-XR) 750 MG 24 hr tablet    Sig: Take 2 tablets (1,500 mg total) by mouth daily with breakfast.    Dispense:  180 tablet    Refill:  3     Follow-up: Return in about 3 months (around 01/26/2023).  Mechele Claude, M.D.

## 2022-10-27 NOTE — Patient Instructions (Signed)
Use Abreva daily for ulcer on lip.

## 2022-10-27 NOTE — Addendum Note (Signed)
Addended by: Adella Hare B on: 10/27/2022 05:05 PM   Modules accepted: Orders

## 2022-10-28 NOTE — Progress Notes (Signed)
Hello Jaser,  Your lab result is normal and/or stable.Some minor variations that are not significant are commonly marked abnormal, but do not represent any medical problem for you.  Best regards, Lenis Nettleton, M.D.

## 2022-11-03 ENCOUNTER — Other Ambulatory Visit: Payer: Self-pay | Admitting: Family Medicine

## 2022-11-03 DIAGNOSIS — M4712 Other spondylosis with myelopathy, cervical region: Secondary | ICD-10-CM

## 2022-11-11 ENCOUNTER — Telehealth: Payer: Self-pay

## 2022-11-11 NOTE — Telephone Encounter (Signed)
Kenneth Stone (Key: [id number]) PA Case ID #: [id number] Rx #: [id number] Need Help? Call us at [phone] Status sent iconSent to Plan today Next Steps The plan will fax you a determination, typically within 1 to 5 business days. Drug Jardiance 10MG  tablets ePA cloud Optician, dispensing PA Form [phone] NCPDP) Original Claim Info [phone]

## 2022-11-18 ENCOUNTER — Other Ambulatory Visit: Payer: Self-pay | Admitting: Family Medicine

## 2022-11-18 DIAGNOSIS — R051 Acute cough: Secondary | ICD-10-CM

## 2022-12-10 ENCOUNTER — Other Ambulatory Visit: Payer: Self-pay | Admitting: Family Medicine

## 2022-12-14 ENCOUNTER — Other Ambulatory Visit: Payer: Self-pay | Admitting: Family Medicine

## 2022-12-23 ENCOUNTER — Encounter: Payer: Self-pay | Admitting: *Deleted

## 2022-12-26 ENCOUNTER — Other Ambulatory Visit: Payer: Self-pay | Admitting: Family Medicine

## 2022-12-26 DIAGNOSIS — R051 Acute cough: Secondary | ICD-10-CM

## 2022-12-31 ENCOUNTER — Other Ambulatory Visit: Payer: Self-pay | Admitting: Family Medicine

## 2023-01-07 ENCOUNTER — Other Ambulatory Visit: Payer: Self-pay | Admitting: Family Medicine

## 2023-01-07 DIAGNOSIS — K219 Gastro-esophageal reflux disease without esophagitis: Secondary | ICD-10-CM

## 2023-01-12 ENCOUNTER — Other Ambulatory Visit (HOSPITAL_COMMUNITY): Payer: Self-pay

## 2023-01-12 NOTE — Telephone Encounter (Signed)
Pharmacy Patient Advocate Encounter  Received notification from [employer] that Prior Authorization for Jardiance 10mg  has been APPROVED from ? to ?Marland Kitchen Unable to obtain price due to refill too soon rejection, last fill date 12/22/22 next available fill date12/19/24   PA #/Case ID/Reference #:[id number]

## 2023-01-20 ENCOUNTER — Other Ambulatory Visit: Payer: Self-pay | Admitting: Family Medicine

## 2023-01-20 DIAGNOSIS — R051 Acute cough: Secondary | ICD-10-CM

## 2023-01-20 DIAGNOSIS — M4712 Other spondylosis with myelopathy, cervical region: Secondary | ICD-10-CM

## 2023-01-29 ENCOUNTER — Ambulatory Visit: Payer: 59 | Admitting: Family Medicine

## 2023-02-02 ENCOUNTER — Ambulatory Visit: Payer: 59 | Admitting: Family Medicine

## 2023-02-03 ENCOUNTER — Other Ambulatory Visit: Payer: Self-pay | Admitting: Family Medicine

## 2023-02-03 NOTE — Telephone Encounter (Signed)
 Name from pharmacy: TRULICITY 4.5 MG/0.5 ML PEN  Pharmacy comment: Alternative Requested:PRIOR AUTH NEEDED; NO LONGER HAS [employer] WHO PAID LAST YEAR.

## 2023-02-05 ENCOUNTER — Other Ambulatory Visit (HOSPITAL_COMMUNITY): Payer: Self-pay

## 2023-02-05 ENCOUNTER — Telehealth: Payer: Self-pay | Admitting: Pharmacy Technician

## 2023-02-05 NOTE — Telephone Encounter (Signed)
 Pharmacy Patient Advocate Encounter   Received notification from RX Request Messages that prior authorization for Trulicity  4.5MG /0.5ML auto-injectors is required/requested.   Insurance verification completed.   The patient is insured through [employer] .   Per test claim: PA required; PA submitted to above mentioned insurance via CoverMyMeds Key/confirmation #/EOC [id number] Status is pending

## 2023-02-05 NOTE — Telephone Encounter (Signed)
 PA request has been Submitted. New Encounter created for follow up. For additional info see Pharmacy Prior Auth telephone encounter from 02/05/2023.

## 2023-02-05 NOTE — Telephone Encounter (Signed)
 Pharmacy Patient Advocate Encounter  Received notification from [facility] that Prior Authorization for Trulicity  4.5MG /0.5ML auto-injectors has been APPROVED from 02/05/2023 to 02/05/2024. Ran test claim, Copay is $15.00. This test claim was processed through [facility]- copay amounts may vary at other pharmacies due to pharmacy/plan contracts, or as the patient moves through the different stages of their insurance plan.   PA #/Case ID/Reference #: [id number]

## 2023-02-09 ENCOUNTER — Ambulatory Visit (INDEPENDENT_AMBULATORY_CARE_PROVIDER_SITE_OTHER): Payer: No Typology Code available for payment source | Admitting: Family Medicine

## 2023-02-09 ENCOUNTER — Encounter: Payer: Self-pay | Admitting: Family Medicine

## 2023-02-09 VITALS — BP 122/77 | HR 72 | Temp 97.3°F | Ht 73.0 in | Wt 233.8 lb

## 2023-02-09 DIAGNOSIS — M4712 Other spondylosis with myelopathy, cervical region: Secondary | ICD-10-CM

## 2023-02-09 DIAGNOSIS — Z23 Encounter for immunization: Secondary | ICD-10-CM | POA: Diagnosis not present

## 2023-02-09 DIAGNOSIS — I10 Essential (primary) hypertension: Secondary | ICD-10-CM

## 2023-02-09 DIAGNOSIS — K219 Gastro-esophageal reflux disease without esophagitis: Secondary | ICD-10-CM

## 2023-02-09 DIAGNOSIS — E785 Hyperlipidemia, unspecified: Secondary | ICD-10-CM

## 2023-02-09 DIAGNOSIS — E119 Type 2 diabetes mellitus without complications: Secondary | ICD-10-CM

## 2023-02-09 DIAGNOSIS — Z7984 Long term (current) use of oral hypoglycemic drugs: Secondary | ICD-10-CM

## 2023-02-09 LAB — CMP14+EGFR
ALT: 47 [IU]/L — ABNORMAL HIGH (ref 0–44)
AST: 36 [IU]/L (ref 0–40)
Albumin: 4.6 g/dL (ref 3.8–4.9)
Alkaline Phosphatase: 46 [IU]/L (ref 44–121)
BUN/Creatinine Ratio: 13 (ref 9–20)
BUN: 10 mg/dL (ref 6–24)
Bilirubin Total: 0.4 mg/dL (ref 0.0–1.2)
CO2: 23 mmol/L (ref 20–29)
Calcium: 9.6 mg/dL (ref 8.7–10.2)
Chloride: 99 mmol/L (ref 96–106)
Creatinine, Ser: 0.77 mg/dL (ref 0.76–1.27)
Globulin, Total: 3.5 g/dL (ref 1.5–4.5)
Glucose: 117 mg/dL — ABNORMAL HIGH (ref 70–99)
Potassium: 4.7 mmol/L (ref 3.5–5.2)
Sodium: 137 mmol/L (ref 134–144)
Total Protein: 8.1 g/dL (ref 6.0–8.5)
eGFR: 103 mL/min/{1.73_m2} (ref >59)

## 2023-02-09 LAB — CBC WITH DIFFERENTIAL/PLATELET
Basophils Absolute: 0 10*3/uL (ref 0.0–0.2)
Basos: 0 %
EOS (ABSOLUTE): 0.4 10*3/uL (ref 0.0–0.4)
Eos: 5 %
Hematocrit: 44.6 % (ref 37.5–51.0)
Hemoglobin: 14.3 g/dL (ref 13.0–17.7)
Immature Grans (Abs): 0 10*3/uL (ref 0.0–0.1)
Immature Granulocytes: 0 %
Lymphocytes Absolute: 3 10*3/uL (ref 0.7–3.1)
Lymphs: 37 %
MCH: 26.9 pg (ref 26.6–33.0)
MCHC: 32.1 g/dL (ref 31.5–35.7)
MCV: 84 fL (ref 79–97)
Monocytes Absolute: 0.8 10*3/uL (ref 0.1–0.9)
Monocytes: 9 %
Neutrophils Absolute: 3.9 10*3/uL (ref 1.4–7.0)
Neutrophils: 49 %
Platelets: 231 10*3/uL (ref 150–450)
RBC: 5.31 x10E6/uL (ref 4.14–5.80)
RDW: 13.2 % (ref 11.6–15.4)
WBC: 8.1 10*3/uL (ref 3.4–10.8)

## 2023-02-09 LAB — LIPID PANEL
Chol/HDL Ratio: 3.1 {ratio} (ref 0.0–5.0)
Cholesterol, Total: 166 mg/dL (ref 100–199)
HDL: 54 mg/dL (ref >39)
LDL Chol Calc (NIH): 99 mg/dL (ref 0–99)
Triglycerides: 69 mg/dL (ref 0–149)
VLDL Cholesterol Cal: 13 mg/dL (ref 5–40)

## 2023-02-09 LAB — BAYER DCA HB A1C WAIVED: HB A1C (BAYER DCA - WAIVED): 6.4 % — ABNORMAL HIGH (ref 4.8–5.6)

## 2023-02-09 MED ORDER — PANTOPRAZOLE SODIUM 40 MG PO TBEC: 40.0000 mg | DELAYED_RELEASE_TABLET | Freq: Every day | ORAL | 3 refills | Status: DC

## 2023-02-09 MED ORDER — DOCUSATE SODIUM 100 MG PO CAPS: 100.0000 mg | ORAL_CAPSULE | Freq: Two times a day (BID) | ORAL | 5 refills | Status: DC

## 2023-02-09 MED ORDER — ESCITALOPRAM OXALATE 10 MG PO TABS: 10.0000 mg | ORAL_TABLET | Freq: Every day | ORAL | 3 refills | Status: DC

## 2023-02-09 MED ORDER — FERROUS SULFATE 325 (65 FE) MG PO TABS: 325.0000 mg | ORAL_TABLET | Freq: Two times a day (BID) | ORAL | 3 refills | Status: DC

## 2023-02-09 NOTE — Progress Notes (Signed)
 Subjective:  Patient ID: Kenneth Stone, male    DOB: 1966-01-03  Age: 58 y.o. MRN: [id number]  CC: Medical Management of Chronic Issues   HPI Kenneth Stone presents forFollow-up of diabetes. Patient checks blood sugar at home.   140 fasting and 189 postprandial Patient denies symptoms such as polyuria, polydipsia, excessive hunger, nausea No significant hypoglycemic spells noted. Medications reviewed. Pt reports taking them regularly without complication/adverse reaction being reported today.    History Kenneth Stone a past medical history of Diabetes mellitus without complication (HCC), Essential hypertension (09/05/2014), GERD (gastroesophageal reflux disease), Hyperlipidemia (12/31/2014), and Spondylosis, cervical, 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   Kenneth Stone a past surgical history that includes Colonoscopy (N/A, 01/23/2017); Colonoscopy with propofol  (N/A, 04/17/2021); Esophagogastroduodenoscopy (egd) with propofol  (N/A, 04/17/2021); and biopsy (04/17/2021).   His family history includes Cancer in his mother; Heart disease in his father.Kenneth Stone reports that Kenneth Stone never smoked. Kenneth Stone never used smokeless tobacco. Kenneth Stone reports current alcohol use of about 2.0 standard drinks of alcohol per week. Kenneth Stone reports that Kenneth Stone does not use drugs.  Current Outpatient Medications on File Prior to Visit  Medication Sig Dispense Refill   atorvastatin  (LIPITOR) 20 MG tablet Take 1 tablet (20 mg total) by mouth daily. 90 tablet 3   Blood Glucose Monitoring Suppl (ACCU-CHEK GUIDE) w/Device KIT Test BS BID Dx E11.9 1 kit 0   celecoxib  (CELEBREX ) 200 MG capsule TAKE 1 CAPSULE (200 MG TOTAL) BY MOUTH DAILY. WITH FOOD FOR ARTHRITIS 30 capsule 0   Dulaglutide  (TRULICITY ) 4.5 MG/0.5ML SOPN Inject 4.5 mg as directed once a week. 6 mL 3   empagliflozin  (JARDIANCE ) 10 MG TABS tablet Take 1 tablet (10 mg total) by mouth daily. 90 tablet 1   fluticasone  (FLONASE ) 50 MCG/ACT nasal spray Place 2 sprays into both nostrils daily. 16 g 6    glucose blood (ACCU-CHEK GUIDE) test strip Test BS BID Dx E11.9 200 each 3   levocetirizine (XYZAL ) 5 MG tablet TAKE 1 TABLET BY MOUTH EVERY DAY IN THE EVENING 90 tablet 1   metFORMIN  (GLUCOPHAGE -XR) 750 MG 24 hr tablet Take 2 tablets (1,500 mg total) by mouth daily with breakfast. 180 tablet 3   ramipril  (ALTACE ) 10 MG capsule Take 1 capsule (10 mg total) by mouth daily. 90 capsule 3   Vitamin D , Ergocalciferol , (DRISDOL ) 1.25 MG (50000 UNIT) CAPS capsule Take 1 capsule (50,000 Units total) by mouth every 7 (seven) days. 13 capsule 3   Zinc  50 MG TABS TAKE 1 TABLET BY MOUTH EVERY DAY 30 tablet 5   No current facility-administered medications on file prior to visit.    ROS Review of Systems  Constitutional:  Negative for fever.  Respiratory:  Negative for shortness of breath.   Cardiovascular:  Negative for chest pain.  Musculoskeletal:  Negative for arthralgias.  Skin:  Negative for rash.    Objective:  BP 122/77   Pulse 72   Temp (!) 97.3 F (36.3 C)   Ht 6' 1 (1.854 m)   Wt 233 lb 12.8 oz (106.1 kg)   SpO2 99%   BMI 30.85 kg/m   BP Readings from Last 3 Encounters:  02/09/23 122/77  10/27/22 122/76  07/23/22 105/74    Wt Readings from Last 3 Encounters:  02/09/23 233 lb 12.8 oz (106.1 kg)  10/27/22 217 lb 3.2 oz (98.5 kg)  07/23/22 213 lb 6.4 oz (96.8 kg)     Physical Exam Vitals reviewed.  Constitutional:      Appearance: Kenneth Stone is well-developed.  HENT:     Head: Normocephalic and atraumatic.     Right Ear: External ear normal.     Left Ear: External ear normal.     Mouth/Throat:     Pharynx: No oropharyngeal exudate or posterior oropharyngeal erythema.  Eyes:     Pupils: Pupils are equal, round, and reactive to light.  Cardiovascular:     Rate and Rhythm: Normal rate and regular rhythm.     Heart sounds: No murmur heard. Pulmonary:     Effort: No respiratory distress.     Breath sounds: Normal breath sounds.  Musculoskeletal:     Cervical back: Normal  range of motion and neck supple.  Neurological:     Mental Status: Kenneth Stone is alert and oriented to person, place, and time.       Assessment & Plan:   Kenneth Stone was seen today for medical management of chronic issues.  Diagnoses and all orders for this visit:  Type 2 diabetes mellitus without complication, without long-term current use of insulin (HCC) -     Bayer DCA Hb A1c Waived -     Microalbumin / creatinine urine ratio  Hyperlipidemia, unspecified hyperlipidemia type -     Lipid panel -     CMP14+EGFR -     CBC with Differential/Platelet -     Bayer DCA Hb A1c Waived  Essential hypertension -     CMP14+EGFR -     CBC with Differential/Platelet -     Bayer DCA Hb A1c Waived  Spondylosis, cervical, with myelopathy  Need for pneumococcal 20-valent conjugate vaccination -     Pneumococcal conjugate vaccine 20-valent (Prevnar 20)  Gastroesophageal reflux disease without esophagitis -     pantoprazole  (PROTONIX ) 40 MG tablet; Take 1 tablet (40 mg total) by mouth daily.  Other orders -     escitalopram  (LEXAPRO ) 10 MG tablet; Take 1 tablet (10 mg total) by mouth daily. -     ferrous sulfate  325 (65 FE) MG tablet; Take 1 tablet (325 mg total) by mouth 2 (two) times daily. -     docusate sodium  (COLACE) 100 MG capsule; Take 1 capsule (100 mg total) by mouth 2 (two) times daily. To help with consitpation      I have discontinued Kenneth Stone's zolpidem , benzonatate , and glipiZIDE . I have also changed his escitalopram  and ferrous sulfate . Additionally, I am having him start on docusate sodium . Lastly, I am having him maintain his Accu-Chek Guide, Accu-Chek Guide, fluticasone , atorvastatin , Vitamin D  (Ergocalciferol ), Zinc , levocetirizine, ramipril , empagliflozin , Trulicity , metFORMIN , celecoxib , and pantoprazole .  Meds ordered this encounter  Medications   escitalopram  (LEXAPRO ) 10 MG tablet    Sig: Take 1 tablet (10 mg total) by mouth daily.    Dispense:  90 tablet    Refill:   3   ferrous sulfate  325 (65 FE) MG tablet    Sig: Take 1 tablet (325 mg total) by mouth 2 (two) times daily.    Dispense:  180 tablet    Refill:  3   pantoprazole  (PROTONIX ) 40 MG tablet    Sig: Take 1 tablet (40 mg total) by mouth daily.    Dispense:  90 tablet    Refill:  3   docusate sodium  (COLACE) 100 MG capsule    Sig: Take 1 capsule (100 mg total) by mouth 2 (two) times daily. To help with consitpation    Dispense:  60 capsule    Refill:  5     Follow-up: Return in about 3  months (around 05/10/2023).  Butler Der, M.D.

## 2023-02-11 NOTE — Progress Notes (Signed)
Hello Jaser,  Your lab result is normal and/or stable.Some minor variations that are not significant are commonly marked abnormal, but do not represent any medical problem for you.  Best regards, Lenis Nettleton, M.D.

## 2023-03-05 ENCOUNTER — Other Ambulatory Visit: Payer: Self-pay | Admitting: Family Medicine

## 2023-03-05 DIAGNOSIS — R051 Acute cough: Secondary | ICD-10-CM

## 2023-03-07 ENCOUNTER — Other Ambulatory Visit: Payer: Self-pay | Admitting: Family Medicine

## 2023-03-07 DIAGNOSIS — M4712 Other spondylosis with myelopathy, cervical region: Secondary | ICD-10-CM

## 2023-03-16 ENCOUNTER — Other Ambulatory Visit: Payer: Self-pay | Admitting: Family Medicine

## 2023-03-16 DIAGNOSIS — M4712 Other spondylosis with myelopathy, cervical region: Secondary | ICD-10-CM

## 2023-03-31 ENCOUNTER — Other Ambulatory Visit: Payer: Self-pay | Admitting: Family Medicine

## 2023-03-31 DIAGNOSIS — R051 Acute cough: Secondary | ICD-10-CM

## 2023-03-31 DIAGNOSIS — M4712 Other spondylosis with myelopathy, cervical region: Secondary | ICD-10-CM

## 2023-04-10 ENCOUNTER — Other Ambulatory Visit: Payer: Self-pay | Admitting: Family Medicine

## 2023-04-10 DIAGNOSIS — J301 Allergic rhinitis due to pollen: Secondary | ICD-10-CM

## 2023-05-02 ENCOUNTER — Other Ambulatory Visit: Payer: Self-pay | Admitting: Family Medicine

## 2023-05-02 DIAGNOSIS — R051 Acute cough: Secondary | ICD-10-CM

## 2023-05-11 ENCOUNTER — Encounter: Payer: Self-pay | Admitting: Family Medicine

## 2023-05-11 ENCOUNTER — Ambulatory Visit: Payer: No Typology Code available for payment source | Admitting: Family Medicine

## 2023-05-11 VITALS — BP 120/74 | HR 58 | Temp 98.1°F | Ht 73.0 in | Wt 230.0 lb

## 2023-05-11 DIAGNOSIS — M4712 Other spondylosis with myelopathy, cervical region: Secondary | ICD-10-CM | POA: Diagnosis not present

## 2023-05-11 DIAGNOSIS — I10 Essential (primary) hypertension: Secondary | ICD-10-CM | POA: Diagnosis not present

## 2023-05-11 DIAGNOSIS — E785 Hyperlipidemia, unspecified: Secondary | ICD-10-CM | POA: Diagnosis not present

## 2023-05-11 DIAGNOSIS — E1169 Type 2 diabetes mellitus with other specified complication: Secondary | ICD-10-CM | POA: Diagnosis not present

## 2023-05-11 DIAGNOSIS — Z7984 Long term (current) use of oral hypoglycemic drugs: Secondary | ICD-10-CM

## 2023-05-11 DIAGNOSIS — E756 Lipid storage disorder, unspecified: Secondary | ICD-10-CM

## 2023-05-11 DIAGNOSIS — E119 Type 2 diabetes mellitus without complications: Secondary | ICD-10-CM

## 2023-05-11 LAB — BAYER DCA HB A1C WAIVED: HB A1C (BAYER DCA - WAIVED): 7.4 % — ABNORMAL HIGH (ref 4.8–5.6)

## 2023-05-11 MED ORDER — ATORVASTATIN CALCIUM 20 MG PO TABS: 20.0000 mg | ORAL_TABLET | Freq: Every day | ORAL | 3 refills | Status: AC

## 2023-05-11 MED ORDER — TIRZEPATIDE 15 MG/0.5ML ~~LOC~~ SOAJ: 15.0000 mg | SUBCUTANEOUS | 1 refills | Status: DC

## 2023-05-11 MED ORDER — CELECOXIB 200 MG PO CAPS: 200.0000 mg | ORAL_CAPSULE | Freq: Every day | ORAL | 0 refills | Status: DC

## 2023-05-11 NOTE — Progress Notes (Signed)
 Subjective:  Patient ID: Kenneth Stone,  male    DOB: Aug 26, 1965  Age: 58 y.o.    CC: Medical Management of Chronic Issues (No concerns)   HPI Kenneth Stone presents for  follow-up of hypertension. Patient has no history of headache chest pain or shortness of breath or recent cough. Patient also denies symptoms of TIA such as numbness weakness lateralizing. Patient denies side effects from medication. States taking it regularly.  Patient also  in for follow-up of elevated cholesterol. Doing well without complaints on current medication. Denies side effects  including myalgia and arthralgia and nausea. Also in today for liver function testing. Currently no chest pain, shortness of breath or other cardiovascular related symptoms noted.  Follow-up of diabetes. Patient does check blood sugar at home. Readings run between 100-140 fasting and 100-200 prandial. Patient denies symptoms such as excessive hunger or urinary frequency, excessive hunger, nausea No significant hypoglycemic spells noted. Medications reviewed. Pt reports taking them regularly. Pt. denies complication/adverse reaction today.    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   He has a past surgical history that includes Colonoscopy (N/A, 01/23/2017); Colonoscopy with propofol (N/A, 04/17/2021); Esophagogastroduodenoscopy (egd) with propofol (N/A, 04/17/2021); and biopsy (04/17/2021).   His family history includes Cancer in his mother; Heart disease in his father.He reports that he has never smoked. He has never used smokeless tobacco. He reports current alcohol use of about 2.0 standard drinks of alcohol per week. He reports that he does not use drugs.  Current Outpatient Medications on File Prior to Visit  Medication Sig  Dispense Refill   Blood Glucose Monitoring Suppl (ACCU-CHEK GUIDE) w/Device KIT Test BS BID Dx E11.9 1 kit 0   docusate sodium (COLACE) 100 MG capsule Take 1 capsule (100 mg total) by mouth 2 (two) times daily. To help with consitpation 60 capsule 5   escitalopram (LEXAPRO) 10 MG tablet Take 1 tablet (10 mg total) by mouth daily. 90 tablet 3   ferrous sulfate 325 (65 FE) MG tablet Take 1 tablet (325 mg total) by mouth 2 (two) times daily. 180 tablet 3   fluticasone (FLONASE) 50 MCG/ACT nasal spray SPRAY 2 SPRAYS INTO EACH NOSTRIL EVERY DAY 16 mL 6   glucose blood (ACCU-CHEK GUIDE) test strip Test BS BID Dx E11.9 200 each 3   JARDIANCE 10 MG TABS tablet TAKE 1 TABLET BY MOUTH EVERY DAY 30 tablet 5   metFORMIN (GLUCOPHAGE-XR) 750 MG 24 hr tablet Take 2 tablets (1,500 mg total) by mouth daily with breakfast. 180 tablet 3   pantoprazole (PROTONIX) 40 MG tablet Take 1 tablet (40 mg total) by mouth daily. 90 tablet 3   ramipril (ALTACE) 10 MG capsule Take 1 capsule (10 mg total) by mouth daily. 90 capsule 3   Vitamin D, Ergocalciferol, (DRISDOL) 1.25 MG (50000 UNIT) CAPS capsule Take 1 capsule (50,000 Units total) by mouth every 7 (seven) days. 13 capsule 3   Zinc 50 MG TABS TAKE 1 TABLET BY MOUTH EVERY DAY 30 tablet 5   No current facility-administered medications on file prior to visit.    ROS Review of Systems  Constitutional:  Negative for fever.  Respiratory:  Negative for shortness of breath.   Cardiovascular:  Negative for chest pain.  Musculoskeletal:  Negative for arthralgias.  Skin:  Negative for rash.    Objective:  BP 120/74   Pulse Aaron Aas)  58   Temp 98.1 F (36.7 C)   Ht 6\' 1"  (1.854 m)   Wt 230 lb (104.3 kg)   SpO2 99%   BMI 30.34 kg/m   BP Readings from Last 3 Encounters:  05/11/23 120/74  02/09/23 122/77  10/27/22 122/76    Wt Readings from Last 3 Encounters:  05/11/23 230 lb (104.3 kg)  02/09/23 233 lb 12.8 oz (106.1 kg)  10/27/22 217 lb 3.2 oz (98.5 kg)    Lab  Results  Component Value Date   HGBA1C 6.4 (H) 02/09/2023   HGBA1C 7.5 (H) 10/27/2022   HGBA1C 6.8 (H) 07/23/2022    Physical Exam Vitals reviewed.  Constitutional:      Appearance: He is well-developed.  HENT:     Head: Normocephalic and atraumatic.     Right Ear: External ear normal.     Left Ear: External ear normal.     Mouth/Throat:     Pharynx: No oropharyngeal exudate or posterior oropharyngeal erythema.  Eyes:     Pupils: Pupils are equal, round, and reactive to light.  Cardiovascular:     Rate and Rhythm: Normal rate and regular rhythm.     Heart sounds: No murmur heard. Pulmonary:     Effort: No respiratory distress.     Breath sounds: Normal breath sounds.  Musculoskeletal:     Cervical back: Normal range of motion and neck supple.  Neurological:     Mental Status: He is alert and oriented to person, place, and time.         Assessment & Plan:  Type 2 diabetes mellitus without complication, without long-term current use of insulin (HCC) -     Bayer DCA Hb A1c Waived -     Microalbumin / creatinine urine ratio  Hyperlipidemia, unspecified hyperlipidemia type -     CMP14+EGFR -     Microalbumin / creatinine urine ratio -     CBC with Differential/Platelet  Essential hypertension -     CMP14+EGFR -     CBC with Differential/Platelet  Diabetic lipidosis (HCC) -     Bayer DCA Hb A1c Waived -     Microalbumin / creatinine urine ratio -     CBC with Differential/Platelet  Spondylosis, cervical, with myelopathy -     Celecoxib; Take 1 capsule (200 mg total) by mouth daily. With food, for arthritis  Dispense: 90 capsule; Refill: 0  Other orders -     Atorvastatin Calcium; Take 1 tablet (20 mg total) by mouth daily.  Dispense: 90 tablet; Refill: 3 -     Tirzepatide; Inject 15 mg into the skin once a week.  Dispense: 6 mL; Refill: 1    Follow-up: Return in about 3 months (around 08/10/2023).  Roise Cleaver, M.D.

## 2023-05-12 LAB — CMP14+EGFR
ALT: 33 IU/L (ref 0–44)
AST: 32 IU/L (ref 0–40)
Albumin: 4.4 g/dL (ref 3.8–4.9)
Alkaline Phosphatase: 49 IU/L (ref 44–121)
BUN/Creatinine Ratio: 14 (ref 9–20)
BUN: 12 mg/dL (ref 6–24)
Bilirubin Total: 0.5 mg/dL (ref 0.0–1.2)
CO2: 22 mmol/L (ref 20–29)
Calcium: 9.4 mg/dL (ref 8.7–10.2)
Chloride: 99 mmol/L (ref 96–106)
Creatinine, Ser: 0.84 mg/dL (ref 0.76–1.27)
Globulin, Total: 3.2 g/dL (ref 1.5–4.5)
Glucose: 98 mg/dL (ref 70–99)
Potassium: 4.4 mmol/L (ref 3.5–5.2)
Sodium: 136 mmol/L (ref 134–144)
Total Protein: 7.6 g/dL (ref 6.0–8.5)
eGFR: 100 mL/min/{1.73_m2} (ref >59)

## 2023-05-12 LAB — CBC WITH DIFFERENTIAL/PLATELET
Basophils Absolute: 0.1 10*3/uL (ref 0.0–0.2)
Basos: 1 %
EOS (ABSOLUTE): 0.5 10*3/uL — ABNORMAL HIGH (ref 0.0–0.4)
Eos: 5 %
Hematocrit: 42.5 % (ref 37.5–51.0)
Hemoglobin: 13.7 g/dL (ref 13.0–17.7)
Immature Grans (Abs): 0 10*3/uL (ref 0.0–0.1)
Immature Granulocytes: 0 %
Lymphocytes Absolute: 3.1 10*3/uL (ref 0.7–3.1)
Lymphs: 35 %
MCH: 26.1 pg — ABNORMAL LOW (ref 26.6–33.0)
MCHC: 32.2 g/dL (ref 31.5–35.7)
MCV: 81 fL (ref 79–97)
Monocytes Absolute: 0.8 10*3/uL (ref 0.1–0.9)
Monocytes: 9 %
Neutrophils Absolute: 4.6 10*3/uL (ref 1.4–7.0)
Neutrophils: 50 %
Platelets: 202 10*3/uL (ref 150–450)
RBC: 5.25 x10E6/uL (ref 4.14–5.80)
RDW: 13.3 % (ref 11.6–15.4)
WBC: 9.1 10*3/uL (ref 3.4–10.8)

## 2023-05-12 LAB — MICROALBUMIN / CREATININE URINE RATIO
Creatinine, Urine: 159.9 mg/dL
Microalb/Creat Ratio: 8 mg/g{creat} (ref 0–29)
Microalbumin, Urine: 13.3 ug/mL

## 2023-05-12 NOTE — Progress Notes (Signed)
Hello Jaser,  Your lab result is normal and/or stable.Some minor variations that are not significant are commonly marked abnormal, but do not represent any medical problem for you.  Best regards, Lenis Nettleton, M.D.

## 2023-05-15 ENCOUNTER — Other Ambulatory Visit (HOSPITAL_COMMUNITY): Payer: Self-pay

## 2023-05-15 ENCOUNTER — Telehealth: Payer: Self-pay | Admitting: Pharmacy Technician

## 2023-05-15 NOTE — Telephone Encounter (Signed)
 Pharmacy Patient Advocate Encounter   Received notification from CoverMyMeds that prior authorization for Mounjaro 15MG /0.5ML auto-injectors is required/requested.   Insurance verification completed.   The patient is insured through [employer] / [employer] .   Per test claim: PA required; PA submitted to above mentioned insurance via CoverMyMeds Key/confirmation #/EOC [id number] Status is pending

## 2023-05-15 NOTE — Telephone Encounter (Unsigned)
 Copied from CRM [id number]. Topic: Clinical - Prescription Issue >> May 15, 2023 11:32 AM Kenneth Stone wrote: Reason for CRM: [facility]zepatide (MOUNJARO) 15 MG/0.5ML Pen. Advised [facility]. Nothing showing in chart the prescription needs a prior authorization. Please reach out to the patient regarding the medication not being filled. Callback number is [phone]

## 2023-05-18 ENCOUNTER — Other Ambulatory Visit (HOSPITAL_COMMUNITY): Payer: Self-pay

## 2023-05-18 NOTE — Telephone Encounter (Signed)
 Pharmacy Patient Advocate Encounter  Received notification from [employer]  that Prior Authorization for Mounjaro 15MG /0.5ML auto-injectors has been APPROVED from 05/15/2023 to 05/14/2024. Ran test claim, Copay is $15.00. This test claim was processed through [facility]- copay amounts may vary at other pharmacies due to pharmacy/plan contracts, or as the patient moves through the different stages of their insurance plan.   PA #/Case ID/Reference #: [id number]

## 2023-05-19 ENCOUNTER — Other Ambulatory Visit: Payer: Self-pay | Admitting: Family Medicine

## 2023-05-19 DIAGNOSIS — R051 Acute cough: Secondary | ICD-10-CM

## 2023-06-04 ENCOUNTER — Ambulatory Visit: Payer: Self-pay

## 2023-06-04 NOTE — Telephone Encounter (Signed)
 Hold off on mounjaro. Follow up in the office.

## 2023-06-04 NOTE — Telephone Encounter (Signed)
 Copied from CRM [id number]. Topic: Clinical - Red Word Triage >> Jun 04, 2023 12:54 PM Crispin Dolphin wrote: Red Word that prompted transfer to Nurse Triage: stomach tightness, can't eat, tired, can't do anything. Thinks mounjaro dose is too high.    Chief Complaint: Pt. Started Mounjaro Sunday and 2 days ago started having vomiting , diarrhea."I think the dose is too high for me." Declines OV. Symptoms: Above Frequency: 2 days ago Pertinent Negatives: Patient denies any other symptoms Disposition: [] ED /[] Urgent Care (no appt availability in office) / [] Appointment(In office/virtual)/ []  [facility]/ [] Home Care/ [] Refused Recommended Disposition /[] [place]/ [x]  Follow-up with PCP Additional Notes: Please advise pt.  Reason for Disposition  [1] Caller has URGENT medicine question about med that PCP or specialist prescribed AND [2] triager unable to answer question  Answer Assessment - Initial Assessment Questions 1. NAME of MEDICINE: "What medicine(s) are you calling about?"     Mounjaro 2. QUESTION: "What is your question?" (e.g., double dose of medicine, side effect)     Causing vomiting, diarrhea 3. PRESCRIBER: "Who prescribed the medicine?" Reason: if prescribed by specialist, call should be referred to that group.     Dr. Veleta Gerold 4. SYMPTOMS: "Do you have any symptoms?" If Yes, ask: "What symptoms are you having?"  "How bad are the symptoms (e.g., mild, moderate, severe)     Vomiting, diarrhea 5. PREGNANCY:  "Is there any chance that you are pregnant?" "When was your last menstrual period?"     N/a  Protocols used: Medication Question Call-A-AH

## 2023-06-05 NOTE — Telephone Encounter (Signed)
 Called NA

## 2023-06-08 ENCOUNTER — Ambulatory Visit (INDEPENDENT_AMBULATORY_CARE_PROVIDER_SITE_OTHER): Admitting: Nurse Practitioner

## 2023-06-08 ENCOUNTER — Encounter: Payer: Self-pay | Admitting: Nurse Practitioner

## 2023-06-08 VITALS — BP 119/67 | HR 62 | Temp 97.5°F | Ht 73.0 in | Wt 218.8 lb

## 2023-06-08 DIAGNOSIS — E119 Type 2 diabetes mellitus without complications: Secondary | ICD-10-CM

## 2023-06-08 DIAGNOSIS — Z7985 Long-term (current) use of injectable non-insulin antidiabetic drugs: Secondary | ICD-10-CM | POA: Diagnosis not present

## 2023-06-08 MED ORDER — MOUNJARO 5 MG/0.5ML ~~LOC~~ SOAJ: 5.0000 mg | SUBCUTANEOUS | 0 refills | Status: DC

## 2023-06-08 NOTE — Progress Notes (Signed)
 Acute Office Visit  Subjective:     Patient ID: Kenneth Stone, male    DOB: 1965/03/13, 58 y.o.   MRN: [id number]  Chief Complaint  Patient presents with   Abdominal Pain    Stomach pain after first dose of mounjaro     HPI Kenneth Stone is a 58 year old male presenting on Jun 08, 2023, for an acute visit due to gastrointestinal side effects following initiation of Mounjaro. He reports being switched from Trulicity  to Mounjaro 15 mg weekly, which he started last week. Since his first dose, he has experienced severe diarrhea, vomiting, and nausea, which he believes are due to the high starting dose. Symptoms have shown some improvement as of yesterday. He states that his pharmacist informed him the typical starting dose should be 2.5 mg, and he is requesting a dose adjustment to reduce side effects. Denies abdominal pain, fever, or blood in stool. Active Ambulatory Problems    Diagnosis Date Noted   Varicose vein of leg 09/05/2014   Diabetes mellitus type 2, controlled, without complications (HCC) 09/05/2014   Essential hypertension 09/05/2014   Gastroesophageal reflux disease without esophagitis 09/05/2014   Vitamin D  deficiency 12/28/2014   Hyperlipidemia 12/31/2014   Hematuria 12/31/2014   Varicose veins of bilateral lower extremities with other complications 02/27/2015   Spondylosis, cervical, with myelopathy 02/06/2016   Radicular pain 03/04/2016   Iron  deficiency anemia 03/25/2021   Abdominal pain 03/25/2021   Acute recurrent frontal sinusitis 01/08/2022   Resolved Ambulatory Problems    Diagnosis Date Noted   Encounter for immunization 12/31/2014   Rectal bleeding 12/11/2016   Past Medical History:  Diagnosis Date   Diabetes mellitus without complication (HCC)    GERD (gastroesophageal reflux disease)      Review of Systems  Constitutional:  Negative for fever.  HENT:  Negative for sore throat.   Respiratory:  Negative for cough, shortness of breath and wheezing.    Cardiovascular:  Negative for chest pain and leg swelling.  Gastrointestinal:  Positive for diarrhea, nausea and vomiting.       After taking his first injection of Mounjero 15 mg   Skin:  Negative for itching and rash.  Neurological:  Negative for dizziness and headaches.   Negative unless indicated in HPI    Objective:    BP 119/67   Pulse 62   Temp (!) 97.5 F (36.4 C) (Temporal)   Ht 6\' 1"  (1.854 m)   Wt 218 lb 12.8 oz (99.2 kg)   SpO2 100%   BMI 28.87 kg/m  BP Readings from Last 3 Encounters:  06/08/23 119/67  05/11/23 120/74  02/09/23 122/77   Wt Readings from Last 3 Encounters:  06/08/23 218 lb 12.8 oz (99.2 kg)  05/11/23 230 lb (104.3 kg)  02/09/23 233 lb 12.8 oz (106.1 kg)      Physical Exam Vitals and nursing note reviewed.  Constitutional:      General: He is not in acute distress. HENT:     Head: Normocephalic and atraumatic.     Nose: Nose normal.     Mouth/Throat:     Mouth: Mucous membranes are moist.  Eyes:     General: No scleral icterus.    Extraocular Movements: Extraocular movements intact.     Conjunctiva/sclera: Conjunctivae normal.     Pupils: Pupils are equal, round, and reactive to light.  Cardiovascular:     Heart sounds: Normal heart sounds.  Pulmonary:     Effort: Pulmonary effort is normal.  Breath sounds: Normal breath sounds.  Abdominal:     General: Bowel sounds are normal.     Palpations: Abdomen is soft.  Musculoskeletal:     Right lower leg: No edema.     Left lower leg: No edema.  Skin:    General: Skin is warm and dry.     Findings: No rash.  Neurological:     Mental Status: He is alert and oriented to person, place, and time.  Psychiatric:        Mood and Affect: Mood normal.        Behavior: Behavior normal.        Thought Content: Thought content normal.        Judgment: Judgment normal.     No results found for any visits on 06/08/23.      Assessment & Plan:  Controlled type 2 diabetes mellitus  without complication, without long-term current use of insulin (HCC) -     Mounjaro; Inject 5 mg into the skin once a week.  Dispense: 6 mL; Refill: 0  Kenneth Stone is a 57 year old [place] male seen today for GI symptoms, no acute distress Diabetes: Last A1c 7.4 %;  DC Mounjaro 15 mg due to GI symptoms; start Mounjaro 5 mg every 7 days; education provided on injection administration and client is instructed to call the clinic for an appointment is having any issue with the injection  Encourage healthy lifestyle choices, including diet (rich in fruits, vegetables, and lean proteins, and low in salt and simple carbohydrates) and exercise (at least 30 minutes of moderate physical activity daily).     The above assessment and management plan was discussed with the patient. The patient verbalized understanding of and has agreed to the management plan. Patient is aware to call the clinic if they develop any new symptoms or if symptoms persist or worsen. Patient is aware when to return to the clinic for a follow-up visit. Patient educated on when it is appropriate to go to the emergency department.  Return if symptoms worsen or fail to improve.  Kenneth Metzger [place] Thompson, DNP [facility] [address] [place], [place] [address] [phone]  Note: This document was prepared by [employer] voice dictation technology and any errors that results from this process are unintentional.

## 2023-06-09 NOTE — Telephone Encounter (Signed)
 Seen yesterday by DOD and it was discussed. LS

## 2023-07-01 ENCOUNTER — Other Ambulatory Visit: Payer: Self-pay | Admitting: Family Medicine

## 2023-07-01 DIAGNOSIS — R051 Acute cough: Secondary | ICD-10-CM

## 2023-07-28 ENCOUNTER — Other Ambulatory Visit: Payer: Self-pay | Admitting: Family Medicine

## 2023-07-28 DIAGNOSIS — R051 Acute cough: Secondary | ICD-10-CM

## 2023-08-10 ENCOUNTER — Ambulatory Visit (INDEPENDENT_AMBULATORY_CARE_PROVIDER_SITE_OTHER)

## 2023-08-10 ENCOUNTER — Encounter: Payer: Self-pay | Admitting: Family Medicine

## 2023-08-10 ENCOUNTER — Ambulatory Visit: Admitting: Family Medicine

## 2023-08-10 VITALS — BP 97/60 | HR 70 | Temp 97.9°F | Ht 73.0 in | Wt 216.0 lb

## 2023-08-10 DIAGNOSIS — E119 Type 2 diabetes mellitus without complications: Secondary | ICD-10-CM | POA: Diagnosis not present

## 2023-08-10 DIAGNOSIS — E785 Hyperlipidemia, unspecified: Secondary | ICD-10-CM | POA: Diagnosis not present

## 2023-08-10 DIAGNOSIS — S83412A Sprain of medial collateral ligament of left knee, initial encounter: Secondary | ICD-10-CM

## 2023-08-10 DIAGNOSIS — M4712 Other spondylosis with myelopathy, cervical region: Secondary | ICD-10-CM | POA: Diagnosis not present

## 2023-08-10 DIAGNOSIS — I10 Essential (primary) hypertension: Secondary | ICD-10-CM | POA: Diagnosis not present

## 2023-08-10 LAB — CMP14+EGFR
ALT: 37 IU/L (ref 0–44)
AST: 36 IU/L (ref 0–40)
Albumin: 4.3 g/dL (ref 3.8–4.9)
Alkaline Phosphatase: 50 IU/L (ref 44–121)
BUN/Creatinine Ratio: 18 (ref 9–20)
BUN: 14 mg/dL (ref 6–24)
Bilirubin Total: 0.4 mg/dL (ref 0.0–1.2)
CO2: 19 mmol/L — ABNORMAL LOW (ref 20–29)
Calcium: 9.3 mg/dL (ref 8.7–10.2)
Chloride: 102 mmol/L (ref 96–106)
Creatinine, Ser: 0.78 mg/dL (ref 0.76–1.27)
Globulin, Total: 3.3 g/dL (ref 1.5–4.5)
Glucose: 116 mg/dL — ABNORMAL HIGH (ref 70–99)
Potassium: 4.4 mmol/L (ref 3.5–5.2)
Sodium: 137 mmol/L (ref 134–144)
Total Protein: 7.6 g/dL (ref 6.0–8.5)
eGFR: 103 mL/min/1.73 (ref >59)

## 2023-08-10 LAB — CBC WITH DIFFERENTIAL/PLATELET
Basophils Absolute: 0 x10E3/uL (ref 0.0–0.2)
Basos: 1 %
EOS (ABSOLUTE): 0.4 x10E3/uL (ref 0.0–0.4)
Eos: 5 %
Hematocrit: 41.4 % (ref 37.5–51.0)
Hemoglobin: 13 g/dL (ref 13.0–17.7)
Immature Grans (Abs): 0 x10E3/uL (ref 0.0–0.1)
Immature Granulocytes: 0 %
Lymphocytes Absolute: 2.7 x10E3/uL (ref 0.7–3.1)
Lymphs: 34 %
MCH: 25.8 pg — ABNORMAL LOW (ref 26.6–33.0)
MCHC: 31.4 g/dL — ABNORMAL LOW (ref 31.5–35.7)
MCV: 82 fL (ref 79–97)
Monocytes Absolute: 0.8 x10E3/uL (ref 0.1–0.9)
Monocytes: 10 %
Neutrophils Absolute: 3.9 x10E3/uL (ref 1.4–7.0)
Neutrophils: 50 %
Platelets: 193 x10E3/uL (ref 150–450)
RBC: 5.04 x10E6/uL (ref 4.14–5.80)
RDW: 13.8 % (ref 11.6–15.4)
WBC: 7.9 x10E3/uL (ref 3.4–10.8)

## 2023-08-10 LAB — MICROSCOPIC EXAMINATION
Bacteria, UA: NONE SEEN
Epithelial Cells (non renal): NONE SEEN /HPF (ref 0–10)
RBC, Urine: NONE SEEN /HPF (ref 0–2)
Renal Epithel, UA: NONE SEEN /HPF
WBC, UA: NONE SEEN /HPF (ref 0–5)
Yeast, UA: NONE SEEN

## 2023-08-10 LAB — URINALYSIS, COMPLETE
Bilirubin, UA: NEGATIVE
Ketones, UA: NEGATIVE
Leukocytes,UA: NEGATIVE
Nitrite, UA: NEGATIVE
Protein,UA: NEGATIVE
RBC, UA: NEGATIVE
Specific Gravity, UA: <1.005 — ABNORMAL LOW (ref 1.005–1.030)
Urobilinogen, Ur: 0.2 mg/dL (ref 0.2–1.0)
pH, UA: 6 (ref 5.0–7.5)

## 2023-08-10 LAB — BAYER DCA HB A1C WAIVED: HB A1C (BAYER DCA - WAIVED): 7 % — ABNORMAL HIGH (ref 4.8–5.6)

## 2023-08-10 MED ORDER — BLOOD GLUCOSE TEST VI STRP: 1.0000 | ORAL_STRIP | Freq: Three times a day (TID) | 0 refills | Status: DC

## 2023-08-10 MED ORDER — LANCETS MISC. MISC: 1.0000 | Freq: Three times a day (TID) | 0 refills | Status: AC

## 2023-08-10 MED ORDER — TIRZEPATIDE 7.5 MG/0.5ML ~~LOC~~ SOAJ: 7.5000 mg | SUBCUTANEOUS | 1 refills | Status: DC

## 2023-08-10 MED ORDER — LANCET DEVICE MISC: 1.0000 | Freq: Three times a day (TID) | 0 refills | Status: AC

## 2023-08-10 MED ORDER — RAMIPRIL 10 MG PO CAPS: 10.0000 mg | ORAL_CAPSULE | Freq: Every day | ORAL | 3 refills | Status: DC

## 2023-08-10 MED ORDER — CELECOXIB 200 MG PO CAPS
200.0000 mg | ORAL_CAPSULE | Freq: Every day | ORAL | 0 refills | Status: DC
Start: 2023-08-10 — End: 2023-11-10

## 2023-08-10 MED ORDER — DOCUSATE SODIUM 100 MG PO CAPS: 100.0000 mg | ORAL_CAPSULE | Freq: Two times a day (BID) | ORAL | 5 refills | Status: AC

## 2023-08-10 MED ORDER — BLOOD GLUCOSE MONITORING SUPPL DEVI: 1.0000 | Freq: Three times a day (TID) | 0 refills | Status: DC

## 2023-08-10 NOTE — Progress Notes (Signed)
 Subjective:  Patient ID: Kenneth Stone,  male    DOB: Oct 04, 1965  Age: 58 y.o.    CC: Diabetes (Would liek to increase mounjaro . Still very hungry. ) and Leg Pain (Left knee pain. Pt fell about 2 weeks ago. X ray did not show anything. Still having pain with movement. )   HPI Kenneth Stone presents for  follow-up of hypertension. Patient has no history of headache chest pain or shortness of breath or recent cough. Patient also denies symptoms of TIA such as numbness weakness lateralizing. Patient denies side effects from medication. States taking it regularly.  Patient also  in for follow-up of elevated cholesterol. Doing well without complaints on current medication. Denies side effects  including myalgia and arthralgia and nausea. Also in today for liver function testing. Currently no chest pain, shortness of breath or other cardiovascular related symptoms noted.  Follow-up of diabetes. Patient does check blood sugar at home. Patient denies symptoms such as excessive hunger or urinary frequency, excessive hunger, nausea No significant hypoglycemic spells noted. Medications reviewed. Pt reports taking them regularly. Pt. denies complication/adverse reaction today.    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   He has a past surgical history that includes Colonoscopy (N/A, 01/23/2017); Colonoscopy with propofol  (N/A, 04/17/2021); Esophagogastroduodenoscopy (egd) with propofol  (N/A, 04/17/2021); and biopsy (04/17/2021).   His family history includes Cancer in his mother; Heart disease in his father.He reports that he has never smoked. He has never used smokeless tobacco. He reports current alcohol use of about 2.0 standard drinks of alcohol per week. He reports that he does not use  drugs.  Current Outpatient Medications on File Prior to Visit  Medication Sig Dispense Refill   atorvastatin  (LIPITOR) 20 MG tablet Take 1 tablet (20 mg total) by mouth daily. 90 tablet 3   Blood Glucose Monitoring Suppl (ACCU-CHEK GUIDE) w/Device KIT Test BS BID Dx E11.9 1 kit 0   escitalopram  (LEXAPRO ) 10 MG tablet Take 1 tablet (10 mg total) by mouth daily. 90 tablet 3   ferrous sulfate  325 (65 FE) MG tablet Take 1 tablet (325 mg total) by mouth 2 (two) times daily. 180 tablet 3   fluticasone  (FLONASE ) 50 MCG/ACT nasal spray SPRAY 2 SPRAYS INTO EACH NOSTRIL EVERY DAY 16 mL 6   glucose blood (ACCU-CHEK GUIDE) test strip Test BS BID Dx E11.9 200 each 3   JARDIANCE  10 MG TABS tablet TAKE 1 TABLET BY MOUTH EVERY DAY 30 tablet 5   metFORMIN  (GLUCOPHAGE -XR) 750 MG 24 hr tablet TAKE 2 TABLETS (1,500 MG TOTAL) BY MOUTH EVERY DAY WITH BREAKFAST 180 tablet 0   pantoprazole  (PROTONIX ) 40 MG tablet Take 1 tablet (40 mg total) by mouth daily. 90 tablet 3   Vitamin D , Ergocalciferol , (DRISDOL ) 1.25 MG (50000 UNIT) CAPS capsule TAKE 1 CAPSULE (50,000 UNITS TOTAL) BY MOUTH EVERY 7 (SEVEN) DAYS 12 capsule 4   Zinc  50 MG TABS TAKE 1 TABLET BY MOUTH EVERY DAY 30 tablet 5   No current facility-administered medications on file prior to visit.    ROS Review of Systems  Constitutional:  Negative for fever.  Respiratory:  Negative for shortness of breath.   Cardiovascular:  Negative for chest pain.  Musculoskeletal:  Negative for arthralgias.  Skin:  Negative for rash.    Objective:  BP 97/60   Pulse 70   Temp 97.9 F (36.6  C)   Ht 6' 1 (1.854 m)   Wt 216 lb (98 kg)   SpO2 98%   BMI 28.50 kg/m   BP Readings from Last 3 Encounters:  08/10/23 97/60  06/08/23 119/67  05/11/23 120/74    Wt Readings from Last 3 Encounters:  08/10/23 216 lb (98 kg)  06/08/23 218 lb 12.8 oz (99.2 kg)  05/11/23 230 lb (104.3 kg)    Lab Results  Component Value Date   HGBA1C 7.0 (H) 08/10/2023   HGBA1C 7.4  (H) 05/11/2023   HGBA1C 6.4 (H) 02/09/2023    Physical Exam Vitals reviewed.  Constitutional:      Appearance: He is well-developed.  HENT:     Head: Normocephalic and atraumatic.     Right Ear: External ear normal.     Left Ear: External ear normal.     Mouth/Throat:     Pharynx: No oropharyngeal exudate or posterior oropharyngeal erythema.  Eyes:     Pupils: Pupils are equal, round, and reactive to light.  Cardiovascular:     Rate and Rhythm: Normal rate and regular rhythm.     Heart sounds: No murmur heard. Pulmonary:     Effort: No respiratory distress.     Breath sounds: Normal breath sounds.  Musculoskeletal:     Cervical back: Normal range of motion and neck supple.  Neurological:     Mental Status: He is alert and oriented to person, place, and time.         Assessment & Plan:  Type 2 diabetes mellitus without complication, without long-term current use of insulin (HCC) -     Bayer DCA Hb A1c Waived -     Urinalysis, Complete  Hyperlipidemia, unspecified hyperlipidemia type -     CMP14+EGFR  Essential hypertension -     CBC with Differential/Platelet  Spondylosis, cervical, with myelopathy -     Celecoxib ; Take 1 capsule (200 mg total) by mouth daily. With food, for arthritis  Dispense: 90 capsule; Refill: 0  Sprain of medial collateral ligament of left knee, initial encounter -     DG Knee 1-2 Views Left; Future  Other orders -     Ramipril ; Take 1 capsule (10 mg total) by mouth daily.  Dispense: 90 capsule; Refill: 3 -     Docusate Sodium ; Take 1 capsule (100 mg total) by mouth 2 (two) times daily. To help with consitpation  Dispense: 60 capsule; Refill: 5 -     Tirzepatide ; Inject 7.5 mg into the skin once a week.  Dispense: 6 mL; Refill: 1 -     Blood Glucose Monitoring Suppl; 1 each by Does not apply route in the morning, at noon, and at bedtime. May substitute to any manufacturer covered by patient's insurance.  Dispense: 1 each; Refill: 0 -      Blood Glucose Test; 1 each by In Vitro route in the morning, at noon, and at bedtime. May substitute to any manufacturer covered by patient's insurance.  Dispense: 100 strip; Refill: 0 -     Lancet Device; 1 each by Does not apply route in the morning, at noon, and at bedtime. May substitute to any manufacturer covered by patient's insurance.  Dispense: 1 each; Refill: 0 -     Lancets Misc.; 1 each by Does not apply route in the morning, at noon, and at bedtime. May substitute to any manufacturer covered by patient's insurance.  Dispense: 100 each; Refill: 0 -     Microscopic Examination  Follow-up: Return in about 3 months (around 11/10/2023).  Butler Der, M.D.

## 2023-08-11 ENCOUNTER — Ambulatory Visit: Payer: Self-pay

## 2023-08-11 ENCOUNTER — Ambulatory Visit: Payer: Self-pay | Admitting: Family Medicine

## 2023-08-11 ENCOUNTER — Other Ambulatory Visit: Payer: Self-pay | Admitting: Nurse Practitioner

## 2023-08-11 DIAGNOSIS — E119 Type 2 diabetes mellitus without complications: Secondary | ICD-10-CM

## 2023-08-11 NOTE — Telephone Encounter (Signed)
 FYI Only or Action Required?: FYI only for provider.  Patient was last seen in primary care on 08/10/2023 by Zollie Lowers, MD.  Called Nurse Triage reporting Fall.  Symptoms began today.  Interventions attempted: Nothing.  Symptoms are: rapidly worsening.  Triage Disposition: Go to ED Now (or PCP Triage)  Patient/caregiver understands and will follow disposition?: Yes    Copied from CRM [id number]. Topic: Clinical - Red Word Triage >> Aug 11, 2023  1:16 PM Antwanette L wrote: Red Word that prompted transfer to Nurse Triage: patient fell and hit his head on the concrete Reason for Disposition  Patient sounds very sick or weak to the triager  Answer Assessment - Initial Assessment Questions 1. MECHANISM: How did the fall happen?     Was at work on a trailer and he fell backward and hit head on concrete; about 45 min. ago 2. DOMESTIC VIOLENCE AND ELDER ABUSE SCREENING: Did you fall because someone pushed you or tried to hurt you? If Yes, ask: Are you safe now?     no 3. ONSET: When did the fall happen? (e.g., minutes, hours, or days ago)     today 4. LOCATION: What part of the body hit the ground? (e.g., back, buttocks, head, hips, knees, hands, head, stomach)     head 5. INJURY: Did you hurt (injure) yourself when you fell? If Yes, ask: What did you injure? Tell me more about this? (e.g., body area; type of injury; pain severity)     Fell on concrete and hit head 6. PAIN: Is there any pain? If Yes, ask: How bad is the pain? (e.g., Scale 0-10; or none, mild,      Na to ER 7. SIZE: For cuts, bruises, or swelling, ask: How large is it? (e.g., inches or centimeters)      To ER 8. PREGNANCY: Is there any chance you are pregnant? When was your last menstrual period?     ER 9. OTHER SYMPTOMS: Do you have any other symptoms? (e.g., dizziness, fever, weakness; new-onset or worsening).      dizziness 10. CAUSE: What do you think caused the fall (or falling)?  (e.g., dizzy spell, tripped)       dizzy  Protocols used: Falls and [facility]

## 2023-08-12 ENCOUNTER — Other Ambulatory Visit: Payer: Self-pay | Admitting: Family Medicine

## 2023-08-12 DIAGNOSIS — R051 Acute cough: Secondary | ICD-10-CM

## 2023-08-14 LAB — IRON AND TIBC
Iron Saturation: 18 % (ref 15–55)
Iron: 68 ug/dL (ref 38–169)
Total Iron Binding Capacity: 370 ug/dL (ref 250–450)
UIBC: 302 ug/dL (ref 111–343)

## 2023-08-14 LAB — TRANSFERRIN: Transferrin: 319 mg/dL (ref 177–329)

## 2023-08-14 LAB — FERRITIN: Ferritin: 148 ng/mL (ref 30–400)

## 2023-08-14 LAB — SPECIMEN STATUS REPORT

## 2023-08-17 NOTE — Progress Notes (Signed)
Hello Jaser,  Your lab result is normal and/or stable.Some minor variations that are not significant are commonly marked abnormal, but do not represent any medical problem for you.  Best regards, Lenis Nettleton, M.D.

## 2023-08-28 ENCOUNTER — Other Ambulatory Visit: Payer: Self-pay | Admitting: Family Medicine

## 2023-08-28 ENCOUNTER — Other Ambulatory Visit: Payer: Self-pay | Admitting: Nurse Practitioner

## 2023-08-28 DIAGNOSIS — E119 Type 2 diabetes mellitus without complications: Secondary | ICD-10-CM

## 2023-09-21 ENCOUNTER — Telehealth: Payer: Self-pay

## 2023-09-21 ENCOUNTER — Other Ambulatory Visit: Payer: Self-pay | Admitting: Family Medicine

## 2023-09-21 DIAGNOSIS — E119 Type 2 diabetes mellitus without complications: Secondary | ICD-10-CM

## 2023-09-21 MED ORDER — TIRZEPATIDE 10 MG/0.5ML ~~LOC~~ SOAJ: 10.0000 mg | SUBCUTANEOUS | 1 refills | Status: DC

## 2023-09-21 NOTE — Telephone Encounter (Signed)
 Copied from CRM #[id number]. Topic: Clinical - Medication Question >> Sep 21, 2023 10:55 AM Kenneth Stone wrote: Reason for CRM: Patient called in stating tirzepatide  (MOUNJARO ) 7.5 MG/0.5ML Pen does not work and would like to know if he can up dosage to 10. Please call [phone]

## 2023-09-21 NOTE — Telephone Encounter (Signed)
 Spoke to patients wife per signed dpr. Patient aware

## 2023-09-21 NOTE — Telephone Encounter (Signed)
 Please let Kenneth Stone know that I sent the Mounjaro  [facility] mg dose to [facility].  He can start it as soon as he is next due a shot.

## 2023-09-24 ENCOUNTER — Telehealth: Payer: Self-pay | Admitting: Pharmacy Technician

## 2023-09-24 NOTE — Telephone Encounter (Signed)
 Pharmacy Patient Advocate Encounter   Received notification from CoverMyMeds that prior authorization for Mounjaro  7.5MG /0.5ML auto-injectors is required/requested.   Insurance verification completed.   The patient is insured through [employer] .  Action: Medication has been discontinued. Archived Key: [id number]

## 2023-10-15 ENCOUNTER — Other Ambulatory Visit: Payer: Self-pay | Admitting: Family Medicine

## 2023-10-27 LAB — OPHTHALMOLOGY REPORT-SCANNED

## 2023-11-03 ENCOUNTER — Other Ambulatory Visit: Payer: Self-pay | Admitting: Family Medicine

## 2023-11-03 DIAGNOSIS — J301 Allergic rhinitis due to pollen: Secondary | ICD-10-CM

## 2023-11-10 ENCOUNTER — Ambulatory Visit: Payer: Self-pay | Admitting: Family Medicine

## 2023-11-10 ENCOUNTER — Ambulatory Visit: Admitting: Family Medicine

## 2023-11-10 ENCOUNTER — Encounter: Payer: Self-pay | Admitting: Family Medicine

## 2023-11-10 VITALS — Wt 217.2 lb

## 2023-11-10 DIAGNOSIS — M4712 Other spondylosis with myelopathy, cervical region: Secondary | ICD-10-CM

## 2023-11-10 DIAGNOSIS — I10 Essential (primary) hypertension: Secondary | ICD-10-CM | POA: Diagnosis not present

## 2023-11-10 DIAGNOSIS — E1169 Type 2 diabetes mellitus with other specified complication: Secondary | ICD-10-CM

## 2023-11-10 DIAGNOSIS — Z7985 Long-term (current) use of injectable non-insulin antidiabetic drugs: Secondary | ICD-10-CM | POA: Diagnosis not present

## 2023-11-10 DIAGNOSIS — M25562 Pain in left knee: Secondary | ICD-10-CM

## 2023-11-10 DIAGNOSIS — Z125 Encounter for screening for malignant neoplasm of prostate: Secondary | ICD-10-CM

## 2023-11-10 DIAGNOSIS — E559 Vitamin D deficiency, unspecified: Secondary | ICD-10-CM

## 2023-11-10 DIAGNOSIS — Z23 Encounter for immunization: Secondary | ICD-10-CM

## 2023-11-10 DIAGNOSIS — E119 Type 2 diabetes mellitus without complications: Secondary | ICD-10-CM

## 2023-11-10 DIAGNOSIS — G8929 Other chronic pain: Secondary | ICD-10-CM

## 2023-11-10 DIAGNOSIS — E756 Lipid storage disorder, unspecified: Secondary | ICD-10-CM

## 2023-11-10 LAB — BAYER DCA HB A1C WAIVED: HB A1C (BAYER DCA - WAIVED): 6.5 % — ABNORMAL HIGH (ref 4.8–5.6)

## 2023-11-10 MED ORDER — TIRZEPATIDE 12.5 MG/0.5ML ~~LOC~~ SOAJ: 12.5000 mg | SUBCUTANEOUS | 1 refills | Status: DC

## 2023-11-10 MED ORDER — CELECOXIB 400 MG PO CAPS
400.0000 mg | ORAL_CAPSULE | Freq: Every day | ORAL | 1 refills | Status: AC
Start: 2023-11-10 — End: ?

## 2023-11-10 MED ORDER — CIPROFLOXACIN-HYDROCORTISONE 0.2-1 % OT SUSP: 3.0000 [drp] | Freq: Two times a day (BID) | OTIC | 0 refills | Status: AC

## 2023-11-10 MED ORDER — RAMIPRIL 5 MG PO CAPS: 5.0000 mg | ORAL_CAPSULE | Freq: Every day | ORAL | 1 refills | Status: DC

## 2023-11-10 NOTE — Progress Notes (Signed)
 Subjective:  Patient ID: Kenneth Stone, male    DOB: [birth year]  Age: 58 y.o. MRN: [id number]  CC: Medical Management of Chronic Issues   HPI  Discussed the use of AI scribe software for clinical note transcription with the patient, who gave verbal consent to proceed.  History of Present Illness Kenneth Stone is a 58 year old male with diabetes and knee pain who presents with ear discomfort and knee pain.  He experiences discomfort in his ears, characterized by a persistent itch, but no pain. He frequently cleans his ears and applies mustard oil for relief. He initially suspected wax buildup. He is concerned about a possible infection and seeks treatment for this issue.  He has been experiencing severe left knee pain for approximately six months, following an incident where his leg got stuck in stairs. The pain is described as 'it's like can't even touch this like that all the time.' Celecoxib  provides some relief, but the pain persists without medication. He reports that an X-ray was performed three months ago.  He has diabetes, with a recent A1c of 6.5, managed with Mounjaro  10 mg weekly and metformin . He wants a higher dose of Mounjaro  to aid in weight loss, as his weight has remained stable over the past three months.  He experiences constipation, which he associates with his diabetes medication, and manages it by consuming fruits like papaya and guava, which he finds helpful.  He is currently taking ramipril  for blood pressure management, preferring to maintain his current dose of 10 mg. He notes that the medication helps him sleep well.          11/10/2023   10:09 AM 08/10/2023   10:15 AM 02/09/2023    9:36 AM  Depression screen PHQ 2/9  Decreased Interest 0 0 0  Down, Depressed, Hopeless 0 0 0  PHQ - 2 Score 0 0 0  Altered sleeping  0   Tired, decreased energy  0   Change in appetite  0   Feeling bad or failure about yourself   0   Trouble concentrating  0   Moving  slowly or fidgety/restless  0   Suicidal thoughts  0   PHQ-9 Score  0   Difficult doing work/chores  Not difficult at all     History Kenneth Stone has a past medical history of Diabetes mellitus without complication (HCC), Essential hypertension (09/05/2014), GERD (gastroesophageal reflux disease), Hyperlipidemia (12/31/2014), and Spondylosis, cervical, 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   He has a past surgical history that includes Colonoscopy (N/A, 01/23/2017); Colonoscopy with propofol  (N/A, 04/17/2021); Esophagogastroduodenoscopy (egd) with propofol  (N/A, 04/17/2021); and biopsy (04/17/2021).   His family history includes Cancer in his mother; Heart disease in his father.He reports that he has never smoked. He has never used smokeless tobacco. He reports current alcohol use of about 2.0 standard drinks of alcohol per week. He reports that he does not use drugs.    ROS Review of Systems  Constitutional: Negative.   HENT: Negative.    Eyes:  Negative for visual disturbance.  Respiratory:  Negative for cough and shortness of breath.   Cardiovascular:  Negative for chest pain and leg swelling.  Gastrointestinal:  Negative for abdominal pain, diarrhea, nausea and vomiting.  Genitourinary:  Negative for difficulty urinating.  Musculoskeletal:  Positive for arthralgias (left knee). Negative for myalgias.  Skin:  Negative for rash.  Neurological:  Negative for headaches.  Psychiatric/Behavioral:  Negative for sleep disturbance.     Objective:  Wt 217  lb 3.2 oz (98.5 kg)   BMI 28.66 kg/m   BP Readings from Last 3 Encounters:  08/10/23 97/60  06/08/23 119/67  05/11/23 120/74    Wt Readings from Last 3 Encounters:  11/10/23 217 lb 3.2 oz (98.5 kg)  08/10/23 216 lb (98 kg)  06/08/23 218 lb 12.8 oz (99.2 kg)     Physical Exam Physical Exam MEASUREMENTS: Height- 6'2. GENERAL: Alert, cooperative, well developed, no acute distress. HEENT: Normocephalic, normal oropharynx, moist mucous  membranes, no cerumen in ears. CHEST: Clear to auscultation bilaterally, no wheezes, rhonchi, or crackles. CARDIOVASCULAR: Normal heart rate and rhythm, S1 and S2 normal without murmurs. ABDOMEN: Soft, non-tender, non-distended, without organomegaly, normal bowel sounds. EXTREMITIES: No cyanosis or edema. NEUROLOGICAL: Cranial nerves grossly intact, moves all extremities without gross motor or sensory deficit.   Assessment & Plan:  Type 2 diabetes mellitus without complication, without long-term current use of insulin (HCC) -     Bayer DCA Hb A1c Waived -     CMP14+EGFR -     Vitamin B12  Essential hypertension -     CBC with Differential/Platelet  Diabetic lipidosis (HCC) -     Lipid panel  Vitamin D  deficiency -     VITAMIN D  25 Hydroxy (Vit-D Deficiency, Fractures)  Prostate cancer screening -     PSA, total and free    Assessment and Plan Assessment & Plan Left knee pain   He has experienced chronic left knee pain for six months, severe and localized to the area of major muscle attachment. Previous X-ray showed no significant findings. Pain worsens with weight-bearing activities, suggesting possible torn cartilage or ligament. Increase celecoxib  dosage for pain management. Refer to an orthopedist for further evaluation. Discuss potential need for weight loss to alleviate knee pain.  Type 2 diabetes mellitus   His type 2 diabetes mellitus is well-controlled with a recent A1c of 6.5. He is on Mounjaro  10 mg weekly, with consideration to increase to 12.5 mg to aid in weight loss. No adverse effects reported from Mounjaro , which does not affect the liver but can impact the pancreas. Increase Mounjaro  dosage to 12.5 mg weekly. Continue metformin  as part of diabetes management.  Essential hypertension   His blood pressure is well-controlled on ramipril  10 mg, but currently low. Reduce ramipril  dosage to 5 mg daily. Monitor blood pressure regularly to assess the need for further  adjustments.  Constipation   Constipation is likely related to Mounjaro  use, with reported improvement from increased fruit intake, specifically papaya and guava. Continue dietary modifications with increased fruit intake to manage constipation.       Follow-up: No follow-ups on file.  Butler Der, M.D.

## 2023-11-11 LAB — CBC WITH DIFFERENTIAL/PLATELET
Basophils Absolute: 0 x10E3/uL (ref 0.0–0.2)
Basos: 1 %
EOS (ABSOLUTE): 0.4 x10E3/uL (ref 0.0–0.4)
Eos: 5 %
Hematocrit: 42.6 % (ref 37.5–51.0)
Hemoglobin: 13.9 g/dL (ref 13.0–17.7)
Immature Grans (Abs): 0 x10E3/uL (ref 0.0–0.1)
Immature Granulocytes: 0 %
Lymphocytes Absolute: 3.2 x10E3/uL — ABNORMAL HIGH (ref 0.7–3.1)
Lymphs: 41 %
MCH: 26.9 pg (ref 26.6–33.0)
MCHC: 32.6 g/dL (ref 31.5–35.7)
MCV: 82 fL (ref 79–97)
Monocytes Absolute: 0.7 x10E3/uL (ref 0.1–0.9)
Monocytes: 9 %
Neutrophils Absolute: 3.5 x10E3/uL (ref 1.4–7.0)
Neutrophils: 44 %
Platelets: 204 x10E3/uL (ref 150–450)
RBC: 5.17 x10E6/uL (ref 4.14–5.80)
RDW: 13.9 % (ref 11.6–15.4)
WBC: 7.8 x10E3/uL (ref 3.4–10.8)

## 2023-11-11 LAB — CMP14+EGFR
ALT: 32 IU/L (ref 0–44)
AST: 29 IU/L (ref 0–40)
Albumin: 4.4 g/dL (ref 3.8–4.9)
Alkaline Phosphatase: 46 IU/L — ABNORMAL LOW (ref 47–123)
BUN/Creatinine Ratio: 19 (ref 9–20)
BUN: 14 mg/dL (ref 6–24)
Bilirubin Total: 0.4 mg/dL (ref 0.0–1.2)
CO2: 23 mmol/L (ref 20–29)
Calcium: 9.8 mg/dL (ref 8.7–10.2)
Chloride: 99 mmol/L (ref 96–106)
Creatinine, Ser: 0.75 mg/dL — ABNORMAL LOW (ref 0.76–1.27)
Globulin, Total: 3.1 g/dL (ref 1.5–4.5)
Glucose: 102 mg/dL — ABNORMAL HIGH (ref 70–99)
Potassium: 4.6 mmol/L (ref 3.5–5.2)
Sodium: 136 mmol/L (ref 134–144)
Total Protein: 7.5 g/dL (ref 6.0–8.5)
eGFR: 104 mL/min/1.73 (ref >59)

## 2023-11-11 LAB — LIPID PANEL
Chol/HDL Ratio: 3 ratio (ref 0.0–5.0)
Cholesterol, Total: 154 mg/dL (ref 100–199)
HDL: 51 mg/dL (ref >39)
LDL Chol Calc (NIH): 93 mg/dL (ref 0–99)
Triglycerides: 46 mg/dL (ref 0–149)
VLDL Cholesterol Cal: 10 mg/dL (ref 5–40)

## 2023-11-11 LAB — PSA, TOTAL AND FREE
PSA, Free Pct: 37.5 %
PSA, Free: 0.15 ng/mL
Prostate Specific Ag, Serum: 0.4 ng/mL (ref 0.0–4.0)

## 2023-11-11 LAB — VITAMIN D 25 HYDROXY (VIT D DEFICIENCY, FRACTURES): Vit D, 25-Hydroxy: 54.8 ng/mL (ref 30.0–100.0)

## 2023-11-11 LAB — VITAMIN B12: Vitamin B-12: 663 pg/mL (ref 232–1245)

## 2023-11-11 NOTE — Progress Notes (Signed)
Hello Jaser,  Your lab result is normal and/or stable.Some minor variations that are not significant are commonly marked abnormal, but do not represent any medical problem for you.  Best regards, Lenis Nettleton, M.D.

## 2023-11-13 ENCOUNTER — Other Ambulatory Visit: Payer: Self-pay | Admitting: Family Medicine

## 2023-11-14 ENCOUNTER — Other Ambulatory Visit: Payer: Self-pay | Admitting: Family Medicine

## 2023-11-25 ENCOUNTER — Other Ambulatory Visit: Payer: Self-pay | Admitting: Family Medicine

## 2023-12-15 ENCOUNTER — Telehealth: Payer: Self-pay | Admitting: Family Medicine

## 2023-12-15 NOTE — Telephone Encounter (Signed)
 Referral sent to: [facility] [address], [address] - [place] [address] [phone]  MyChart Message sent to Patient with Specialty Office contact information.

## 2023-12-15 NOTE — Telephone Encounter (Signed)
 Called and spoke with patients wife and made her aware of referral info. She voiced understanding.

## 2023-12-15 NOTE — Telephone Encounter (Signed)
 Checking on referral Stacks put in 11-21-2023.

## 2023-12-30 ENCOUNTER — Other Ambulatory Visit: Payer: Self-pay | Admitting: Family Medicine

## 2023-12-30 DIAGNOSIS — E119 Type 2 diabetes mellitus without complications: Secondary | ICD-10-CM

## 2024-01-28 ENCOUNTER — Other Ambulatory Visit: Payer: Self-pay | Admitting: Family Medicine

## 2024-02-15 ENCOUNTER — Ambulatory Visit: Payer: Self-pay | Admitting: Family Medicine

## 2024-02-15 ENCOUNTER — Encounter: Payer: Self-pay | Admitting: Family Medicine

## 2024-02-15 VITALS — BP 146/70 | HR 67 | Temp 98.2°F | Ht 73.0 in | Wt 218.0 lb

## 2024-02-15 DIAGNOSIS — E119 Type 2 diabetes mellitus without complications: Secondary | ICD-10-CM

## 2024-02-15 DIAGNOSIS — Z7985 Long-term (current) use of injectable non-insulin antidiabetic drugs: Secondary | ICD-10-CM | POA: Diagnosis not present

## 2024-02-15 DIAGNOSIS — K219 Gastro-esophageal reflux disease without esophagitis: Secondary | ICD-10-CM | POA: Diagnosis not present

## 2024-02-15 DIAGNOSIS — D509 Iron deficiency anemia, unspecified: Secondary | ICD-10-CM | POA: Diagnosis not present

## 2024-02-15 DIAGNOSIS — E1169 Type 2 diabetes mellitus with other specified complication: Secondary | ICD-10-CM | POA: Diagnosis not present

## 2024-02-15 DIAGNOSIS — E756 Lipid storage disorder, unspecified: Secondary | ICD-10-CM | POA: Diagnosis not present

## 2024-02-15 LAB — COMPREHENSIVE METABOLIC PANEL WITH GFR
ALT: 31 IU/L (ref 0–44)
AST: 26 IU/L (ref 0–40)
Albumin: 4.5 g/dL (ref 3.8–4.9)
Alkaline Phosphatase: 50 IU/L (ref 47–123)
BUN/Creatinine Ratio: 16 (ref 10–24)
BUN: 13 mg/dL (ref 8–27)
Bilirubin Total: 0.6 mg/dL (ref 0.0–1.2)
CO2: 24 mmol/L (ref 20–29)
Calcium: 9.8 mg/dL (ref 8.6–10.2)
Chloride: 99 mmol/L (ref 96–106)
Creatinine, Ser: 0.79 mg/dL (ref 0.76–1.27)
Globulin, Total: 3.4 g/dL (ref 1.5–4.5)
Glucose: 115 mg/dL — ABNORMAL HIGH (ref 70–99)
Potassium: 4.5 mmol/L (ref 3.5–5.2)
Sodium: 138 mmol/L (ref 134–144)
Total Protein: 7.9 g/dL (ref 6.0–8.5)
eGFR: 102 mL/min/1.73

## 2024-02-15 LAB — CBC WITH DIFFERENTIAL/PLATELET
Basophils Absolute: 0.1 x10E3/uL (ref 0.0–0.2)
Basos: 1 %
EOS (ABSOLUTE): 0.4 x10E3/uL (ref 0.0–0.4)
Eos: 4 %
Hematocrit: 42.8 % (ref 37.5–51.0)
Hemoglobin: 13.9 g/dL (ref 13.0–17.7)
Immature Grans (Abs): 0 x10E3/uL (ref 0.0–0.1)
Immature Granulocytes: 0 %
Lymphocytes Absolute: 3.8 x10E3/uL — ABNORMAL HIGH (ref 0.7–3.1)
Lymphs: 42 %
MCH: 26.8 pg (ref 26.6–33.0)
MCHC: 32.5 g/dL (ref 31.5–35.7)
MCV: 83 fL (ref 79–97)
Monocytes Absolute: 0.8 x10E3/uL (ref 0.1–0.9)
Monocytes: 9 %
Neutrophils Absolute: 3.9 x10E3/uL (ref 1.4–7.0)
Neutrophils: 44 %
Platelets: 209 x10E3/uL (ref 150–450)
RBC: 5.19 x10E6/uL (ref 4.14–5.80)
RDW: 13.2 % (ref 11.6–15.4)
WBC: 9 x10E3/uL (ref 3.4–10.8)

## 2024-02-15 LAB — BAYER DCA HB A1C WAIVED: HB A1C (BAYER DCA - WAIVED): 7 % — ABNORMAL HIGH (ref 4.8–5.6)

## 2024-02-15 MED ORDER — PANTOPRAZOLE SODIUM 40 MG PO TBEC: 40.0000 mg | DELAYED_RELEASE_TABLET | Freq: Every day | ORAL | 3 refills | Status: AC

## 2024-02-15 MED ORDER — FERROUS SULFATE 325 (65 FE) MG PO TABS: 325.0000 mg | ORAL_TABLET | Freq: Two times a day (BID) | ORAL | 3 refills | Status: AC

## 2024-02-15 MED ORDER — ESCITALOPRAM OXALATE 10 MG PO TABS: 10.0000 mg | ORAL_TABLET | Freq: Every day | ORAL | 0 refills | Status: AC

## 2024-02-15 MED ORDER — TIRZEPATIDE 15 MG/0.5ML ~~LOC~~ SOAJ: 15.0000 mg | SUBCUTANEOUS | 3 refills | Status: AC

## 2024-02-15 MED ORDER — SYNJARDY XR 10-1000 MG PO TB24: 1.0000 | ORAL_TABLET | Freq: Every day | ORAL | 3 refills | Status: AC

## 2024-02-15 MED ORDER — RAMIPRIL 10 MG PO CAPS: 10.0000 mg | ORAL_CAPSULE | Freq: Every day | ORAL | 3 refills | Status: AC

## 2024-02-15 MED ORDER — METFORMIN HCL ER 750 MG PO TB24: 750.0000 mg | ORAL_TABLET | Freq: Every day | ORAL | 0 refills | Status: DC

## 2024-02-15 MED ORDER — ZINC 50 MG PO TABS: 1.0000 | ORAL_TABLET | Freq: Every day | ORAL | 5 refills | Status: AC

## 2024-02-15 NOTE — Progress Notes (Signed)
 "  Subjective:  Patient ID: Kenneth Stone, male    DOB: [birth year]  Age: 59 y.o. MRN: [id number]  CC: Medical Management of Chronic Issues (Interested in synjardy /Would like to go back on the old BP meds. Seemed to work better. Bp running high and causing headaches. )   HPI  Discussed the use of AI scribe software for clinical note transcription with the patient, who gave verbal consent to proceed.  History of Present Illness Kenneth Stone is a 59 year old male with hypertension and type 2 diabetes who presents for medication management and evaluation of headaches.  He experiences persistent headaches, which he associates with his current blood pressure medication. His blood pressure was low at one point, leading to a reduction in his medication dose from 10 mg to 5 mg.  He is currently taking Mounjaro  for type 2 diabetes and finds it beneficial. His son also has diabetes and was on a similar medication regimen but has since stopped taking pills.  He experiences leg pain, particularly in the mornings when he first gets up, which lasts about an hour. The pain occurs in his calves and sometimes feels like swelling. The pain subsides after walking around.  He reports occasional low blood sugar episodes, which he attributes to taking extra pills or consuming too many sweets. He manages these episodes by consuming sweet foods or drinks to raise his blood sugar levels.  He experiences constipation and has tried stool softeners in the past, which he found bothersome. He prefers to manage this with dietary changes, such as eating fruits like strawberries and apples. No stomach pain, diarrhea, or significant bowel issues aside from constipation.          11/10/2023   10:09 AM 08/10/2023   10:15 AM 02/09/2023    9:36 AM  Depression screen PHQ 2/9  Decreased Interest 0 0 0  Down, Depressed, Hopeless 0 0 0  PHQ - 2 Score 0 0 0  Altered sleeping  0   Tired, decreased energy  0   Change in  appetite  0   Feeling bad or failure about yourself   0   Trouble concentrating  0   Moving slowly or fidgety/restless  0   Suicidal thoughts  0   PHQ-9 Score  0    Difficult doing work/chores  Not difficult at all      Data saved with a previous flowsheet row definition    History Kenneth Stone has a past medical history of Diabetes mellitus without complication (HCC), Essential hypertension (09/05/2014), GERD (gastroesophageal reflux disease), Hyperlipidemia (12/31/2014), and Spondylosis, cervical, 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   He has a past surgical history that includes Colonoscopy (N/A, 01/23/2017); Colonoscopy with propofol  (N/A, 04/17/2021); Esophagogastroduodenoscopy (egd) with propofol  (N/A, 04/17/2021); and biopsy (04/17/2021).   His family history includes Cancer in his mother; Heart disease in his father.He reports that he has never smoked. He has never used smokeless tobacco. He reports current alcohol use of about 2.0 standard drinks of alcohol per week. He reports that he does not use drugs.    ROS Review of Systems  Constitutional: Negative.  Negative for fever.  HENT: Negative.    Eyes:  Negative for visual disturbance.  Respiratory:  Negative for cough and shortness of breath.   Cardiovascular:  Negative for chest pain and leg swelling.  Gastrointestinal:  Negative for abdominal pain, diarrhea, nausea and vomiting.  Genitourinary:  Negative for difficulty urinating.  Musculoskeletal:  Negative for arthralgias and myalgias.  Skin:  Negative for rash.  Neurological:  Negative for headaches.  Psychiatric/Behavioral:  Negative for sleep disturbance.     Objective:  BP (!) 146/70   Pulse 67   Temp 98.2 F (36.8 C)   Ht 6' 1 (1.854 m)   Wt 218 lb (98.9 kg)   SpO2 97%   BMI 28.76 kg/m   BP Readings from Last 3 Encounters:  02/15/24 (!) 146/70  08/10/23 97/60  06/08/23 119/67    Wt Readings from Last 3 Encounters:  02/15/24 218 lb (98.9 kg)  11/10/23 217 lb 3.2 oz  (98.5 kg)  08/10/23 216 lb (98 kg)     Physical Exam Physical Exam GENERAL: Alert, cooperative, well developed, no acute distress HEENT: Normocephalic, normal oropharynx, moist mucous membranes CHEST: Clear to auscultation bilaterally, No wheezes, rhonchi, or crackles CARDIOVASCULAR: Normal heart rate and rhythm, S1 and S2 normal without murmurs ABDOMEN: Soft, non-tender, non-distended, without organomegaly, Normal bowel sounds EXTREMITIES: No cyanosis or edema NEUROLOGICAL: Cranial nerves grossly intact, Moves all extremities without gross motor or sensory deficit   Assessment & Plan:  Diabetic lipidosis (HCC) -     Microalbumin / creatinine urine ratio -     Bayer DCA Hb A1c Waived -     Comprehensive metabolic panel with GFR  Type 2 diabetes mellitus without complication, without long-term current use of insulin (HCC) -     Microalbumin / creatinine urine ratio -     Bayer DCA Hb A1c Waived -     Comprehensive metabolic panel with GFR  Iron  deficiency anemia, unspecified iron  deficiency anemia type -     CBC with Differential/Platelet  Gastroesophageal reflux disease without esophagitis -     Pantoprazole  Sodium; Take 1 tablet (40 mg total) by mouth daily.  Dispense: 90 tablet; Refill: 3  Other orders -     Escitalopram  Oxalate; Take 1 tablet (10 mg total) by mouth daily.  Dispense: 90 tablet; Refill: 0 -     Zinc ; Take 1 tablet (50 mg total) by mouth daily.  Dispense: 30 tablet; Refill: 5 -     Ferrous Sulfate ; Take 1 tablet (325 mg total) by mouth 2 (two) times daily.  Dispense: 180 tablet; Refill: 3 -     Ramipril ; Take 1 capsule (10 mg total) by mouth daily.  Dispense: 90 capsule; Refill: 3 -     Tirzepatide ; Inject 15 mg into the skin once a week.  Dispense: 6 mL; Refill: 3 -     Synjardy  XR; Take 1 tablet by mouth daily.  Dispense: 90 tablet; Refill: 3    Assessment and Plan Assessment & Plan Hypertension   Blood pressure is elevated with associated headaches,  likely due to suboptimal control with the current medication regimen. Increased blood pressure medication to 10 mg. He is instructed to monitor blood pressure at home and report if headaches do not improve.  Type 2 diabetes mellitus   A1c is 7.0, indicating suboptimal control. Current regimen includes Mounjaro , metformin , and Jardiance . Discussed combining metformin  and Jardiance  into Synjardy  for convenience and potential improved control. Increased Mounjaro  to 15 mg for better glycemic control. Prescribed Synjardy  to replace metformin  and Jardiance . A1c will be reassessed in three months.  Iron  deficiency anemia   Reports leg pain and heaviness, particularly in the mornings, possibly related to anemia or poor circulation. Symptoms improve with rest. Checked iron  levels. Will monitor symptoms and reassess if he worsens or occurs more frequently.       Follow-up: Return in about  3 months (around 05/15/2024) for diabetes.  Butler Der, M.D. "

## 2024-02-16 LAB — MICROALBUMIN / CREATININE URINE RATIO
Creatinine, Urine: 36.7 mg/dL
Microalb/Creat Ratio: <8 mg/g{creat} (ref 0–29)
Microalbumin, Urine: <3 ug/mL

## 2024-02-17 ENCOUNTER — Telehealth: Payer: Self-pay

## 2024-02-17 NOTE — Telephone Encounter (Signed)
 No call documented. LS

## 2024-02-17 NOTE — Telephone Encounter (Signed)
 Copied from CRM #[id number]. Topic: General - Call Back - No Documentation >> Feb 17, 2024 10:53 AM Gustabo D wrote: Pt returning a missed call

## 2024-02-18 ENCOUNTER — Ambulatory Visit: Payer: Self-pay | Admitting: Family Medicine

## 2024-02-18 NOTE — Progress Notes (Signed)
Hello Jaser,  Your lab result is normal and/or stable.Some minor variations that are not significant are commonly marked abnormal, but do not represent any medical problem for you.  Best regards, Lenis Nettleton, M.D.

## 2024-02-24 ENCOUNTER — Other Ambulatory Visit: Payer: Self-pay | Admitting: Family Medicine

## 2024-02-24 DIAGNOSIS — M4712 Other spondylosis with myelopathy, cervical region: Secondary | ICD-10-CM

## 2024-02-29 ENCOUNTER — Other Ambulatory Visit: Payer: Self-pay | Admitting: Family Medicine

## 2024-02-29 DIAGNOSIS — K219 Gastro-esophageal reflux disease without esophagitis: Secondary | ICD-10-CM

## 2024-05-18 ENCOUNTER — Ambulatory Visit: Admitting: Family Medicine
